# Patient Record
Sex: Female | Born: 1990 | Hispanic: Yes | Marital: Single | Smoking: Never smoker
Health system: Southern US, Community
[De-identification: ages and names within clinical notes are randomized; demographics above are authoritative.]

## PROBLEM LIST (undated history)

## (undated) ENCOUNTER — Inpatient Hospital Stay (HOSPITAL_COMMUNITY): Payer: Self-pay

## (undated) DIAGNOSIS — J45909 Unspecified asthma, uncomplicated: Secondary | ICD-10-CM

## (undated) HISTORY — DX: Unspecified asthma, uncomplicated: J45.909

## (undated) HISTORY — PX: NO PAST SURGERIES: SHX2092

---

## 2016-05-14 LAB — OB RESULTS CONSOLE PLATELET COUNT: PLATELETS: 348 10*3/uL

## 2016-05-14 LAB — OB RESULTS CONSOLE HGB/HCT, BLOOD
HCT: 38 %
Hemoglobin: 13.3 g/dL

## 2016-05-14 LAB — SICKLE CELL SCREEN: SICKLE CELL SCREEN: NEGATIVE

## 2016-05-14 LAB — OB RESULTS CONSOLE TSH: TSH: 0.16

## 2016-05-14 LAB — CYSTIC FIBROSIS DIAGNOSTIC STUDY: INTERPRETATION-CFDNA: NEGATIVE

## 2016-05-14 LAB — OB RESULTS CONSOLE ABO/RH: RH Type: POSITIVE

## 2016-05-14 LAB — OB RESULTS CONSOLE ANTIBODY SCREEN: Antibody Screen: NEGATIVE

## 2016-05-14 LAB — OB RESULTS CONSOLE HIV ANTIBODY (ROUTINE TESTING)
HIV: NONREACTIVE
HIV: NONREACTIVE

## 2016-05-29 LAB — OB RESULTS CONSOLE RUBELLA ANTIBODY, IGM: RUBELLA: IMMUNE

## 2016-05-29 LAB — OB RESULTS CONSOLE RPR: RPR: NONREACTIVE

## 2016-05-29 LAB — OB RESULTS CONSOLE HEPATITIS B SURFACE ANTIGEN: HEP B S AG: NEGATIVE

## 2016-06-07 NOTE — L&D Delivery Note (Signed)
26 y.o. G1P0 at 4730w2d delivered a viable female infant in cephalic, ROA position. Anterior shoulder delivered with ease. 60 sec delayed cord clamping. Cord clamped x2 and cut. Placenta delivered spontaneously intact, with 3VC. Fundus firm on exam with massage and pitocin. Good hemostasis noted.  Anesthesia: Epidural Laceration: bilateral labial lacerations, minor, no sutures required Good hemostasis noted. EBL: 200 cc  Mom and baby recovering in LDR.    Apgars: APGAR (1 MIN): 9   APGAR (5 MINS): 9     Weight: Pending skin to skin  Sponge and instrument count were correct x2. Placenta sent to L&D  Howard PouchLauren Deatrice Spanbauer, MD PGY-1 Family Medicine 11/09/2016, 4:42 PM

## 2016-08-13 LAB — OB RESULTS CONSOLE HGB/HCT, BLOOD
HCT: 34 %
Hemoglobin: 11.5 g/dL

## 2016-08-13 LAB — OB RESULTS CONSOLE PLATELET COUNT: PLATELETS: 334 10*3/uL

## 2016-09-02 ENCOUNTER — Encounter: Payer: Self-pay | Admitting: *Deleted

## 2016-09-08 ENCOUNTER — Encounter: Payer: Self-pay | Admitting: *Deleted

## 2016-09-24 ENCOUNTER — Ambulatory Visit (INDEPENDENT_AMBULATORY_CARE_PROVIDER_SITE_OTHER): Payer: BLUE CROSS/BLUE SHIELD | Admitting: Obstetrics and Gynecology

## 2016-09-24 ENCOUNTER — Encounter: Payer: Self-pay | Admitting: Obstetrics and Gynecology

## 2016-09-24 VITALS — BP 122/72 | HR 86 | Ht 62.0 in | Wt 170.1 lb

## 2016-09-24 DIAGNOSIS — O9989 Other specified diseases and conditions complicating pregnancy, childbirth and the puerperium: Secondary | ICD-10-CM | POA: Diagnosis not present

## 2016-09-24 DIAGNOSIS — Z3403 Encounter for supervision of normal first pregnancy, third trimester: Secondary | ICD-10-CM | POA: Diagnosis not present

## 2016-09-24 DIAGNOSIS — Z113 Encounter for screening for infections with a predominantly sexual mode of transmission: Secondary | ICD-10-CM | POA: Diagnosis not present

## 2016-09-24 DIAGNOSIS — N898 Other specified noninflammatory disorders of vagina: Secondary | ICD-10-CM

## 2016-09-24 LAB — OB RESULTS CONSOLE GC/CHLAMYDIA: Gonorrhea: NEGATIVE

## 2016-09-24 NOTE — Progress Notes (Signed)
  Subjective:    Kim Powell is a G1P0 [redacted]w[redacted]d being seen today for her first obstetrical visit.  Her obstetrical history is significant for transfer of care from [place]. Patient does intend to breast feed. Pregnancy history fully reviewed.  Patient reports no complaints.  Vitals:   09/24/16 0956 09/24/16 0957  BP: 122/72   Pulse: 86   Weight: 170 lb 1.6 oz (77.2 kg)   Height:   (1.575 m)    HISTORY: OB History  Gravida Para Term Preterm AB Living  1            SAB TAB Ectopic Multiple Live Births               # Outcome Date GA Lbr Len/2nd Weight Sex Delivery Anes PTL Lv  1 Current              Past Medical History:  Diagnosis Date  . Asthma    Past Surgical History:  Procedure Laterality Date  . NO PAST SURGERIES     Family History  Problem Relation Age of Onset  . Cancer Maternal Grandmother      Exam    Uterus:  Fundal Height: 32 cm  Pelvic Exam:       Assessment:    Pregnancy: G1P0 Patient Active Problem List   Diagnosis Date Noted  . Encounter for supervision of normal first pregnancy in third trimester 09/24/2016        Plan:     Initial labs reviewed- pap smear missing from records and requested Prenatal vitamins. Problem list reviewed and updated. Genetic Screening discussed Quad Screen: results reviewed.  Ultrasound discussed; fetal survey: results reviewed. Patient has concerns regarding yeast infection- wet prep collected Patient reports being followed as high risk due to fetal macrosomia- ultrasound ordered for growth  Follow up in 2 weeks. 50% of 30 min visit spent on counseling and coordination of care.     Dianne Whelchel 09/24/2016

## 2016-09-24 NOTE — Progress Notes (Signed)
OB US scheduled for May 2nd @ 1245.  Pt notified

## 2016-09-24 NOTE — Progress Notes (Signed)
New OB packet given  Decline TDAP and FLU Has yeast infection  Last pap 03/2016 Baby scripts app Plans to breastfeed

## 2016-09-24 NOTE — Patient Instructions (Signed)
Third Trimester of Pregnancy The third trimester is from week 28 through week 40 (months 7 through 9). The third trimester is a time when the unborn baby (fetus) is growing rapidly. At the end of the ninth month, the fetus is about 20 inches in length and weighs 6-10 pounds. Body changes during your third trimester Your body will continue to go through many changes during pregnancy. The changes vary from woman to woman. During the third trimester:  Your weight will continue to increase. You can expect to gain 25-35 pounds (11-16 kg) by the end of the pregnancy.  You may begin to get stretch marks on your hips, abdomen, and breasts.  You may urinate more often because the fetus is moving lower into your pelvis and pressing on your bladder.  You may develop or continue to have heartburn. This is caused by increased hormones that slow down muscles in the digestive tract.  You may develop or continue to have constipation because increased hormones slow digestion and cause the muscles that push waste through your intestines to relax.  You may develop hemorrhoids. These are swollen veins (varicose veins) in the rectum that can itch or be painful.  You may develop swollen, bulging veins (varicose veins) in your legs.  You may have increased body aches in the pelvis, back, or thighs. This is due to weight gain and increased hormones that are relaxing your joints.  You may have changes in your hair. These can include thickening of your hair, rapid growth, and changes in texture. Some women also have hair loss during or after pregnancy, or hair that feels dry or thin. Your hair will most likely return to normal after your baby is born.  Your breasts will continue to grow and they will continue to become tender. A yellow fluid (colostrum) may leak from your breasts. This is the first milk you are producing for your baby.  Your belly button may stick out.  You may notice more swelling in your hands,  face, or ankles.  You may have increased tingling or numbness in your hands, arms, and legs. The skin on your belly may also feel numb.  You may feel short of breath because of your expanding uterus.  You may have more problems sleeping. This can be caused by the size of your belly, increased need to urinate, and an increase in your body's metabolism.  You may notice the fetus "dropping," or moving lower in your abdomen (lightening).  You may have increased vaginal discharge.  You may notice your joints feel loose and you may have pain around your pelvic bone. What to expect at prenatal visits You will have prenatal exams every 2 weeks until week 36. Then you will have weekly prenatal exams. During a routine prenatal visit:  You will be weighed to make sure you and the baby are growing normally.  Your blood pressure will be taken.  Your abdomen will be measured to track your baby's growth.  The fetal heartbeat will be listened to.  Any test results from the previous visit will be discussed.  You may have a cervical check near your due date to see if your cervix has softened or thinned (effaced).  You will be tested for Group B streptococcus. This happens between 35 and 37 weeks. Your health care provider may ask you:  What your birth plan is.  How you are feeling.  If you are feeling the baby move.  If you have had any abnormal   symptoms, such as leaking fluid, bleeding, severe headaches, or abdominal cramping.  If you are using any tobacco products, including cigarettes, chewing tobacco, and electronic cigarettes.  If you have any questions. Other tests or screenings that may be performed during your third trimester include:  Blood tests that check for low iron levels (anemia).  Fetal testing to check the health, activity level, and growth of the fetus. Testing is done if you have certain medical conditions or if there are problems during the pregnancy.  Nonstress test  (NST). This test checks the health of your baby to make sure there are no signs of problems, such as the baby not getting enough oxygen. During this test, a belt is placed around your belly. The baby is made to move, and its heart rate is monitored during movement. What is false labor? False labor is a condition in which you feel small, irregular tightenings of the muscles in the womb (contractions) that usually go away with rest, changing position, or drinking water. These are called Braxton Hicks contractions. Contractions may last for hours, days, or even weeks before true labor sets in. If contractions come at regular intervals, become more frequent, increase in intensity, or become painful, you should see your health care provider. What are the signs of labor?  Abdominal cramps.  Regular contractions that start at 10 minutes apart and become stronger and more frequent with time.  Contractions that start on the top of the uterus and spread down to the lower abdomen and back.  Increased pelvic pressure and dull back pain.  A watery or bloody mucus discharge that comes from the vagina.  Leaking of amniotic fluid. This is also known as your "water breaking." It could be a slow trickle or a gush. Let your health care provider know if it has a color or strange odor. If you have any of these signs, call your health care provider right away, even if it is before your due date. Follow these instructions at home: Medicines   Follow your health care provider's instructions regarding medicine use. Specific medicines may be either safe or unsafe to take during pregnancy.  Take a prenatal vitamin that contains at least 600 micrograms (mcg) of folic acid.  If you develop constipation, try taking a stool softener if your health care provider approves. Eating and drinking   Eat a balanced diet that includes fresh fruits and vegetables, whole grains, good sources of protein such as meat, eggs, or tofu,  and low-fat dairy. Your health care provider will help you determine the amount of weight gain that is right for you.  Avoid raw meat and uncooked cheese. These carry germs that can cause birth defects in the baby.  If you have low calcium intake from food, talk to your health care provider about whether you should take a daily calcium supplement.  Eat four or five small meals rather than three large meals a day.  Limit foods that are high in fat and processed sugars, such as fried and sweet foods.  To prevent constipation:  Drink enough fluid to keep your urine clear or pale yellow.  Eat foods that are high in fiber, such as fresh fruits and vegetables, whole grains, and beans. Activity   Exercise only as directed by your health care provider. Most women can continue their usual exercise routine during pregnancy. Try to exercise for 30 minutes at least 5 days a week. Stop exercising if you experience uterine contractions.  Avoid heavy lifting.  lifting.  Do not exercise in extreme heat or humidity, or at high altitudes.  Wear low-heel, comfortable shoes.  Practice good posture.  You may continue to have sex unless your health care provider tells you otherwise. Relieving pain and discomfort   Take frequent breaks and rest with your legs elevated if you have leg cramps or low back pain.  Take warm sitz baths to soothe any pain or discomfort caused by hemorrhoids. Use hemorrhoid cream if your health care provider approves.  Wear a good support bra to prevent discomfort from breast tenderness.  If you develop varicose veins:  Wear support pantyhose or compression stockings as told by your healthcare provider.  Elevate your feet for 15 minutes, 3-4 times a day. Prenatal care   Write down your questions. Take them to your prenatal visits.  Keep all your prenatal visits as told by your health care provider. This is important. Safety   Wear your seat belt  at all times when driving.  Make a list of emergency phone numbers, including numbers for family, friends, the hospital, and police and fire departments. General instructions   Avoid cat litter boxes and soil used by cats. These carry germs that can cause birth defects in the baby. If you have a cat, ask someone to clean the litter box for you.  Do not travel far distances unless it is absolutely necessary and only with the approval of your health care provider.  Do not use hot tubs, steam rooms, or saunas.  Do not drink alcohol.  Do not use any products that contain nicotine or tobacco, such as cigarettes and e-cigarettes. If you need help quitting, ask your health care provider.  Do not use any medicinal herbs or unprescribed drugs. These chemicals affect the formation and growth of the baby.  Do not douche or use tampons or scented sanitary pads.  Do not cross your legs for long periods of time.  To prepare for the arrival of your baby:  Take prenatal classes to understand, practice, and ask questions about labor and delivery.  Make a trial run to the hospital.  Visit the hospital and tour the maternity area.  Arrange for maternity or paternity leave through employers.  Arrange for family and friends to take care of pets while you are in the hospital.  Purchase a rear-facing car seat and make sure you know how to install it in your car.  Pack your hospital bag.  Prepare the baby's nursery. Make sure to remove all pillows and stuffed animals from the baby's crib to prevent suffocation.  Visit your dentist if you have not gone during your pregnancy. Use a soft toothbrush to brush your teeth and be gentle when you floss. Contact a health care provider if:  You are unsure if you are in labor or if your water has broken.  You become dizzy.  You have mild pelvic cramps, pelvic pressure, or nagging pain in your abdominal area.  You have lower back pain.  You have  persistent nausea, vomiting, or diarrhea.  You have an unusual or bad smelling vaginal discharge.  You have pain when you urinate. Get help right away if:  Your water breaks before 37 weeks.  You have regular contractions less than 5 minutes apart before 37 weeks.  You have a fever.  You are leaking fluid from your vagina.  You have spotting or bleeding from your vagina.  You have severe abdominal pain or cramping.  You have rapid weight   weight gain.  You have shortness of breath with chest pain.  You notice sudden or extreme swelling of your face, hands, ankles, feet, or legs.  Your baby makes fewer than 10 movements in 2 hours.  You have severe headaches that do not go away when you take medicine.  You have vision changes. Summary  The third trimester is from week 28 through week 40, months 7 through 9. The third trimester is a time when the unborn baby (fetus) is growing rapidly.  During the third trimester, your discomfort may increase as you and your baby continue to gain weight. You may have abdominal, leg, and back pain, sleeping problems, and an increased need to urinate.  During the third trimester your breasts will keep growing and they will continue to become tender. A yellow fluid (colostrum) may leak from your breasts. This is the first milk you are producing for your baby.  False labor is a condition in which you feel small, irregular tightenings of the muscles in the womb (contractions) that eventually go away. These are called Braxton Hicks contractions. Contractions may last for hours, days, or even weeks before true labor sets in.  Signs of labor can include: abdominal cramps; regular contractions that start at 10 minutes apart and become stronger and more frequent with time; watery or bloody mucus discharge that comes from the vagina; increased pelvic pressure and dull back pain; and leaking of amniotic fluid. This information is not intended to replace advice given  to you by your health care provider. Make sure you discuss any questions you have with your health care provider. Document Released: 05/18/2001 Document Revised: 10/30/2015 Document Reviewed: 07/25/2012 Elsevier Interactive Patient Education  2017 [employer].  Breastfeeding Deciding to breastfeed is one of the best choices you can make for you and your baby. A change in hormones during pregnancy causes your breast tissue to grow and increases the number and size of your milk ducts. These hormones also allow proteins, sugars, and fats from your blood supply to make breast milk in your milk-producing glands. Hormones prevent breast milk from being released before your baby is born as well as prompt milk flow after birth. Once breastfeeding has begun, thoughts of your baby, as well as his or her sucking or crying, can stimulate the release of milk from your milk-producing glands. Benefits of breastfeeding For Your Baby  Your first milk (colostrum) helps your baby's digestive system function better.  There are antibodies in your milk that help your baby fight off infections.  Your baby has a lower incidence of asthma, allergies, and sudden infant death syndrome.  The nutrients in breast milk are better for your baby than infant formulas and are designed uniquely for your baby's needs.  Breast milk improves your baby's brain development.  Your baby is less likely to develop other conditions, such as childhood obesity, asthma, or type 2 diabetes mellitus. For You  Breastfeeding helps to create a very special bond between you and your baby.  Breastfeeding is convenient. Breast milk is always available at the correct temperature and costs nothing.  Breastfeeding helps to burn calories and helps you lose the weight gained during pregnancy.  Breastfeeding makes your uterus contract to its prepregnancy size faster and slows bleeding (lochia) after you give birth.  Breastfeeding helps to lower  your risk of developing type 2 diabetes mellitus, osteoporosis, and breast or ovarian cancer later in life. Signs that your baby is hungry Early Signs of Hunger  Increased  alertness or activity.  Stretching.  Movement of the head from side to side.  Movement of the head and opening of the mouth when the corner of the mouth or cheek is stroked (rooting).  Increased sucking sounds, smacking lips, cooing, sighing, or squeaking.  Hand-to-mouth movements.  Increased sucking of fingers or hands. Late Signs of Hunger  Fussing.  Intermittent crying. Extreme Signs of Hunger  Signs of extreme hunger will require calming and consoling before your baby will be able to breastfeed successfully. Do not wait for the following signs of extreme hunger to occur before you initiate breastfeeding:  Restlessness.  A loud, strong cry.  Screaming. Breastfeeding basics  Breastfeeding Initiation  Find a comfortable place to sit or lie down, with your neck and back well supported.  Place a pillow or rolled up blanket under your baby to bring him or her to the level of your breast (if you are seated). Nursing pillows are specially designed to help support your arms and your baby while you breastfeed.  Make sure that your baby's abdomen is facing your abdomen.  Gently massage your breast. With your fingertips, massage from your chest wall toward your nipple in a circular motion. This encourages milk flow. You may need to continue this action during the feeding if your milk flows slowly.  Support your breast with 4 fingers underneath and your thumb above your nipple. Make sure your fingers are well away from your nipple and your baby's mouth.  Stroke your baby's lips gently with your finger or nipple.  When your baby's mouth is open wide enough, quickly bring your baby to your breast, placing your entire nipple and as much of the colored area around your nipple (areola) as possible into your baby's  mouth.  More areola should be visible above your baby's upper lip than below the lower lip.  Your baby's tongue should be between his or her lower gum and your breast.  Ensure that your baby's mouth is correctly positioned around your nipple (latched). Your baby's lips should create a seal on your breast and be turned out (everted).  It is common for your baby to suck about 2-3 minutes in order to start the flow of breast milk. Latching  Teaching your baby how to latch on to your breast properly is very important. An improper latch can cause nipple pain and decreased milk supply for you and poor weight gain in your baby. Also, if your baby is not latched onto your nipple properly, he or she may swallow some air during feeding. This can make your baby fussy. Burping your baby when you switch breasts during the feeding can help to get rid of the air. However, teaching your baby to latch on properly is still the best way to prevent fussiness from swallowing air while breastfeeding. Signs that your baby has successfully latched on to your nipple:  Silent tugging or silent sucking, without causing you pain.  Swallowing heard between every 3-4 sucks.  Muscle movement above and in front of his or her ears while sucking. Signs that your baby has not successfully latched on to nipple:  Sucking sounds or smacking sounds from your baby while breastfeeding.  Nipple pain. If you think your baby has not latched on correctly, slip your finger into the corner of your baby's mouth to break the suction and place it between your baby's gums. Attempt breastfeeding initiation again. Signs of Successful Breastfeeding  Signs from your baby:  A gradual decrease in  the number of sucks or complete cessation of sucking.  Falling asleep.  Relaxation of his or her body.  Retention of a small amount of milk in his or her mouth.  Letting go of your breast by himself or herself. Signs from you:  Breasts that  have increased in firmness, weight, and size 1-3 hours after feeding.  Breasts that are softer immediately after breastfeeding.  Increased milk volume, as well as a change in milk consistency and color by the fifth day of breastfeeding.  Nipples that are not sore, cracked, or bleeding. Signs That Your Pecola Leisure is Getting Enough Milk  Wetting at least 1-2 diapers during the first 24 hours after birth.  Wetting at least 5-6 diapers every 24 hours for the first week after birth. The urine should be clear or pale yellow by 5 days after birth.  Wetting 6-8 diapers every 24 hours as your baby continues to grow and develop.  At least 3 stools in a 24-hour period by age 2 days. The stool should be soft and yellow.  At least 3 stools in a 24-hour period by age 24 days. The stool should be seedy and yellow.  No loss of weight greater than 10% of birth weight during the first 70 days of age.  Average weight gain of 4-7 ounces (113-198 g) per week after age 22 days.  Consistent daily weight gain by age 2 days, without weight loss after the age of 2 weeks. After a feeding, your baby may spit up a small amount. This is common. Breastfeeding frequency and duration Frequent feeding will help you make more milk and can prevent sore nipples and breast engorgement. Breastfeed when you feel the need to reduce the fullness of your breasts or when your baby shows signs of hunger. This is called "breastfeeding on demand." Avoid introducing a pacifier to your baby while you are working to establish breastfeeding (the first 4-6 weeks after your baby is born). After this time you may choose to use a pacifier. Research has shown that pacifier use during the first year of a baby's life decreases the risk of sudden infant death syndrome (SIDS). Allow your baby to feed on each breast as long as he or she wants. Breastfeed until your baby is finished feeding. When your baby unlatches or falls asleep while feeding from the  first breast, offer the second breast. Because newborns are often sleepy in the first few weeks of life, you may need to awaken your baby to get him or her to feed. Breastfeeding times will vary from baby to baby. However, the following rules can serve as a guide to help you ensure that your baby is properly fed:  Newborns (babies 64 weeks of age or younger) may breastfeed every 1-3 hours.  Newborns should not go longer than 3 hours during the day or 5 hours during the night without breastfeeding.  You should breastfeed your baby a minimum of 8 times in a 24-hour period until you begin to introduce solid foods to your baby at around 55 months of age. Breast milk pumping Pumping and storing breast milk allows you to ensure that your baby is exclusively fed your breast milk, even at times when you are unable to breastfeed. This is especially important if you are going back to work while you are still breastfeeding or when you are not able to be present during feedings. Your lactation consultant can give you guidelines on how long it is safe to store breast  milk. A breast pump is a machine that allows you to pump milk from your breast into a sterile bottle. The pumped breast milk can then be stored in a refrigerator or freezer. Some breast pumps are operated by hand, while others use electricity. Ask your lactation consultant which type will work best for you. Breast pumps can be purchased, but some hospitals and breastfeeding support groups lease breast pumps on a monthly basis. A lactation consultant can teach you how to hand express breast milk, if you prefer not to use a pump. Caring for your breasts while you breastfeed Nipples can become dry, cracked, and sore while breastfeeding. The following recommendations can help keep your breasts moisturized and healthy:  Avoid using soap on your nipples.  Wear a supportive bra. Although not required, special nursing bras and tank tops are designed to allow  access to your breasts for breastfeeding without taking off your entire bra or top. Avoid wearing underwire-style bras or extremely tight bras.  Air dry your nipples for 3-39minutes after each feeding.  Use only cotton bra pads to absorb leaked breast milk. Leaking of breast milk between feedings is normal.  Use lanolin on your nipples after breastfeeding. Lanolin helps to maintain your skin's normal moisture barrier. If you use pure lanolin, you do not need to wash it off before feeding your baby again. Pure lanolin is not toxic to your baby. You may also hand express a few drops of breast milk and gently massage that milk into your nipples and allow the milk to air dry. In the first few weeks after giving birth, some women experience extremely full breasts (engorgement). Engorgement can make your breasts feel heavy, warm, and tender to the touch. Engorgement peaks within 3-5 days after you give birth. The following recommendations can help ease engorgement:  Completely empty your breasts while breastfeeding or pumping. You may want to start by applying warm, moist heat (in the shower or with warm water-soaked hand towels) just before feeding or pumping. This increases circulation and helps the milk flow. If your baby does not completely empty your breasts while breastfeeding, pump any extra milk after he or she is finished.  Wear a snug bra (nursing or regular) or tank top for 1-2 days to signal your body to slightly decrease milk production.  Apply ice packs to your breasts, unless this is too uncomfortable for you.  Make sure that your baby is latched on and positioned properly while breastfeeding. If engorgement persists after 48 hours of following these recommendations, contact your health care provider or a Advertising copywriter. Overall health care recommendations while breastfeeding  Eat healthy foods. Alternate between meals and snacks, eating 3 of each per day. Because what you eat  affects your breast milk, some of the foods may make your baby more irritable than usual. Avoid eating these foods if you are sure that they are negatively affecting your baby.  Drink milk, fruit juice, and water to satisfy your thirst (about 10 glasses a day).  Rest often, relax, and continue to take your prenatal vitamins to prevent fatigue, stress, and anemia.  Continue breast self-awareness checks.  Avoid chewing and smoking tobacco. Chemicals from cigarettes that pass into breast milk and exposure to secondhand smoke may harm your baby.  Avoid alcohol and drug use, including marijuana. Some medicines that may be harmful to your baby can pass through breast milk. It is important to ask your health care provider before taking any medicine, including all over-the-counter and  prescription medicine as well as vitamin and herbal supplements. It is possible to become pregnant while breastfeeding. If birth control is desired, ask your health care provider about options that will be safe for your baby. Contact a health care provider if:  You feel like you want to stop breastfeeding or have become frustrated with breastfeeding.  You have painful breasts or nipples.  Your nipples are cracked or bleeding.  Your breasts are red, tender, or warm.  You have a swollen area on either breast.  You have a fever or chills.  You have nausea or vomiting.  You have drainage other than breast milk from your nipples.  Your breasts do not become full before feedings by the fifth day after you give birth.  You feel sad and depressed.  Your baby is too sleepy to eat well.  Your baby is having trouble sleeping.  Your baby is wetting less than 3 diapers in a 24-hour period.  Your baby has less than 3 stools in a 24-hour period.  Your baby's skin or the white part of his or her eyes becomes yellow.  Your baby is not gaining weight by 76 days of age. Get help right away if:  Your baby is overly  tired (lethargic) and does not want to wake up and feed.  Your baby develops an unexplained fever. This information is not intended to replace advice given to you by your health care provider. Make sure you discuss any questions you have with your health care provider. Document Released: 05/24/2005 Document Revised: 11/05/2015 Document Reviewed: 11/15/2012 Elsevier Interactive Patient Education  2017 Elsevier Inc.  Etonogestrel implant What is this medicine? ETONOGESTREL (et oh noe JES trel) is a contraceptive (birth control) device. It is used to prevent pregnancy. It can be used for up to 3 years. This medicine may be used for other purposes; ask your health care provider or pharmacist if you have questions. COMMON BRAND NAME(S): Implanon, Nexplanon What should I tell my health care provider before I take this medicine? They need to know if you have any of these conditions: -abnormal vaginal bleeding -blood vessel disease or blood clots -cancer of the breast, cervix, or liver -depression -diabetes -gallbladder disease -headaches -heart disease or recent heart attack -high blood pressure -high cholesterol -kidney disease -liver disease -renal disease -seizures -tobacco smoker -an unusual or allergic reaction to etonogestrel, other hormones, anesthetics or antiseptics, medicines, foods, dyes, or preservatives -pregnant or trying to get pregnant -breast-feeding How should I use this medicine? This device is inserted just under the skin on the inner side of your upper arm by a health care professional. Talk to your pediatrician regarding the use of this medicine in children. Special care may be needed. Overdosage: If you think you have taken too much of this medicine contact a poison control center or emergency room at once. NOTE: This medicine is only for you. Do not share this medicine with others. What if I miss a dose? This does not apply. What may interact with this  medicine? Do not take this medicine with any of the following medications: -amprenavir -bosentan -fosamprenavir This medicine may also interact with the following medications: -barbiturate medicines for inducing sleep or treating seizures -certain medicines for fungal infections like ketoconazole and itraconazole -grapefruit juice -griseofulvin -medicines to treat seizures like carbamazepine, felbamate, oxcarbazepine, phenytoin, topiramate -modafinil -phenylbutazone -rifampin -rufinamide -some medicines to treat HIV infection like atazanavir, indinavir, lopinavir, nelfinavir, tipranavir, ritonavir -St. John's wort This list may not describe  all possible interactions. Give your health care provider a list of all the medicines, herbs, non-prescription drugs, or dietary supplements you use. Also tell them if you smoke, drink alcohol, or use illegal drugs. Some items may interact with your medicine. What should I watch for while using this medicine? This product does not protect you against HIV infection (AIDS) or other sexually transmitted diseases. You should be able to feel the implant by pressing your fingertips over the skin where it was inserted. Contact your doctor if you cannot feel the implant, and use a non-hormonal birth control method (such as condoms) until your doctor confirms that the implant is in place. If you feel that the implant may have broken or become bent while in your arm, contact your healthcare provider. What side effects may I notice from receiving this medicine? Side effects that you should report to your doctor or health care professional as soon as possible: -allergic reactions like skin rash, itching or hives, swelling of the face, lips, or tongue -breast lumps -changes in emotions or moods -depressed mood -heavy or prolonged menstrual bleeding -pain, irritation, swelling, or bruising at the insertion site -scar at site of insertion -signs of infection at the  insertion site such as fever, and skin redness, pain or discharge -signs of pregnancy -signs and symptoms of a blood clot such as breathing problems; changes in vision; chest pain; severe, sudden headache; pain, swelling, warmth in the leg; trouble speaking; sudden numbness or weakness of the face, arm or leg -signs and symptoms of liver injury like dark yellow or brown urine; general ill feeling or flu-like symptoms; light-colored stools; loss of appetite; nausea; right upper belly pain; unusually weak or tired; yellowing of the eyes or skin -unusual vaginal bleeding, discharge -signs and symptoms of a stroke like changes in vision; confusion; trouble speaking or understanding; severe headaches; sudden numbness or weakness of the face, arm or leg; trouble walking; dizziness; loss of balance or coordination Side effects that usually do not require medical attention (report to your doctor or health care professional if they continue or are bothersome): -acne -back pain -breast pain -changes in weight -dizziness -general ill feeling or flu-like symptoms -headache -irregular menstrual bleeding -nausea -sore throat -vaginal irritation or inflammation This list may not describe all possible side effects. Call your doctor for medical advice about side effects. You may report side effects to FDA at 1-800-FDA-1088. Where should I keep my medicine? This drug is given in a hospital or clinic and will not be stored at home. NOTE: This sheet is a summary. It may not cover all possible information. If you have questions about this medicine, talk to your doctor, pharmacist, or health care provider.  2018 Elsevier/Gold Standard (2015-12-11 11:19:22)

## 2016-09-27 LAB — CERVICOVAGINAL ANCILLARY ONLY
Bacterial vaginitis: POSITIVE — AB
Chlamydia: NEGATIVE
Neisseria Gonorrhea: NEGATIVE
Trichomonas: NEGATIVE

## 2016-09-28 ENCOUNTER — Telehealth: Payer: Self-pay | Admitting: *Deleted

## 2016-09-28 ENCOUNTER — Encounter: Payer: Self-pay | Admitting: Obstetrics and Gynecology

## 2016-09-28 ENCOUNTER — Other Ambulatory Visit: Payer: Self-pay | Admitting: Obstetrics and Gynecology

## 2016-09-28 MED ORDER — METRONIDAZOLE 500 MG PO TABS: 500.0000 mg | ORAL_TABLET | Freq: Two times a day (BID) | ORAL | 0 refills | Status: DC

## 2016-09-28 NOTE — Telephone Encounter (Signed)
Per Dr. Jolayne Panther patient has bv and needs to be treated with Flagyl 500 mg bid for 7 days. Sent mychart message to patient informing her of the results and asked that she let us know what pharmacy to send medication to.

## 2016-10-06 ENCOUNTER — Ambulatory Visit (HOSPITAL_COMMUNITY)
Admission: RE | Admit: 2016-10-06 | Discharge: 2016-10-06 | Disposition: A | Discharge status: Home / Self Care | Payer: PRIVATE HEALTH INSURANCE | Source: Ambulatory Visit | Attending: Obstetrics and Gynecology | Admitting: Obstetrics and Gynecology

## 2016-10-06 DIAGNOSIS — Z3403 Encounter for supervision of normal first pregnancy, third trimester: Secondary | ICD-10-CM

## 2016-10-08 ENCOUNTER — Ambulatory Visit (INDEPENDENT_AMBULATORY_CARE_PROVIDER_SITE_OTHER): Payer: PRIVATE HEALTH INSURANCE | Admitting: Obstetrics and Gynecology

## 2016-10-08 VITALS — BP 119/74 | HR 89 | Wt 174.6 lb

## 2016-10-08 DIAGNOSIS — Z3403 Encounter for supervision of normal first pregnancy, third trimester: Secondary | ICD-10-CM

## 2016-10-08 NOTE — Progress Notes (Signed)
   PRENATAL VISIT NOTE  Subjective:  Kim Powell is a [age] y.o. G1P0 at 3144w5d being seen today for ongoing prenatal care.  She is currently monitored for the following issues for this low-risk pregnancy and has Encounter for supervision of normal first pregnancy in third trimester on her problem list.  Patient reports no complaints.  Contractions: Not present. Vag. Bleeding: None.  Movement: Present. Denies leaking of fluid.   The following portions of the patient's history were reviewed and updated as appropriate: allergies, current medications, past family history, past medical history, past social history, past surgical history and problem list. Problem list updated.  Objective:   Vitals:   10/08/16 0936  BP: 119/74  Pulse: 89  Weight: 174 lb 9.6 oz (79.2 kg)    Fetal Status: Fetal Heart Rate (bpm): 140 Fundal Height: 35 cm Movement: Present     General:  Alert, oriented and cooperative. Patient is in no acute distress.  Skin: Skin is warm and dry. No rash noted.   Cardiovascular: Normal heart rate noted  Respiratory: Normal respiratory effort, no problems with respiration noted  Abdomen: Soft, gravid, appropriate for gestational age. Pain/Pressure: Absent     Pelvic:  Cervical exam deferred        Extremities: Normal range of motion.  Edema: None  Mental Status: Normal mood and affect. Normal behavior. Normal judgment and thought content.   Assessment and Plan:  Pregnancy: G1P0 at 2644w5d  1. Encounter for supervision of normal first pregnancy in third trimester Patient is doing well without complaints Follow up ultrasound on 5/11 Patient still needs to research pediatrician   Preterm labor symptoms and general obstetric precautions including but not limited to vaginal bleeding, contractions, leaking of fluid and fetal movement were reviewed in detail with the patient. Please refer to After Visit Summary for other counseling recommendations.  Return in about 1 week (around  10/15/2016) for ROB.   Catalina AntiguaPeggy Kameron Glazebrook, MD

## 2016-10-15 ENCOUNTER — Ambulatory Visit (HOSPITAL_COMMUNITY)
Admission: RE | Admit: 2016-10-15 | Discharge: 2016-10-15 | Disposition: A | Discharge status: Home / Self Care | Payer: BLUE CROSS/BLUE SHIELD | Source: Ambulatory Visit | Attending: Obstetrics and Gynecology | Admitting: Obstetrics and Gynecology

## 2016-10-15 ENCOUNTER — Other Ambulatory Visit: Payer: Self-pay | Admitting: Obstetrics and Gynecology

## 2016-10-15 ENCOUNTER — Inpatient Hospital Stay (HOSPITAL_COMMUNITY)
Admission: AD | Admit: 2016-10-15 | Discharge: 2016-10-15 | Disposition: A | Discharge status: Home / Self Care | Payer: BLUE CROSS/BLUE SHIELD | Source: Ambulatory Visit | Attending: Family Medicine | Admitting: Family Medicine

## 2016-10-15 ENCOUNTER — Encounter (HOSPITAL_COMMUNITY): Payer: Self-pay | Admitting: *Deleted

## 2016-10-15 DIAGNOSIS — Z3689 Encounter for other specified antenatal screening: Secondary | ICD-10-CM

## 2016-10-15 DIAGNOSIS — O99513 Diseases of the respiratory system complicating pregnancy, third trimester: Secondary | ICD-10-CM | POA: Diagnosis not present

## 2016-10-15 DIAGNOSIS — Z87891 Personal history of nicotine dependence: Secondary | ICD-10-CM | POA: Insufficient documentation

## 2016-10-15 DIAGNOSIS — O26893 Other specified pregnancy related conditions, third trimester: Secondary | ICD-10-CM | POA: Diagnosis not present

## 2016-10-15 DIAGNOSIS — Z3A35 35 weeks gestation of pregnancy: Secondary | ICD-10-CM

## 2016-10-15 DIAGNOSIS — J45909 Unspecified asthma, uncomplicated: Secondary | ICD-10-CM | POA: Diagnosis not present

## 2016-10-15 DIAGNOSIS — Z3403 Encounter for supervision of normal first pregnancy, third trimester: Secondary | ICD-10-CM

## 2016-10-15 DIAGNOSIS — Z809 Family history of malignant neoplasm, unspecified: Secondary | ICD-10-CM | POA: Diagnosis not present

## 2016-10-15 DIAGNOSIS — N898 Other specified noninflammatory disorders of vagina: Secondary | ICD-10-CM | POA: Insufficient documentation

## 2016-10-15 LAB — WET PREP, GENITAL
CLUE CELLS WET PREP: NONE SEEN
SPERM: NONE SEEN
Trich, Wet Prep: NONE SEEN
Yeast Wet Prep HPF POC: NONE SEEN

## 2016-10-15 LAB — POCT FERN TEST: POCT FERN TEST: NEGATIVE

## 2016-10-15 NOTE — MAU Note (Signed)
Pt c/o leaking for the last four days. Pt states she will feel a little pressure and then notice that her underwear are wet. Pt states she feels it most when she sneezes or coughs. Pt states baby is moving normally. Pt denies bleeding and contractions.

## 2016-10-15 NOTE — Discharge Instructions (Signed)
Premature Rupture and Preterm Premature Rupture of Membranes °Rupture of membranes is when the membranes (amniotic sac) that hold your baby break open. This is commonly referred to as your "water breaking." If your water breaks before labor starts (prematurely), it is called premature rupture of membranes (PROM). If PROM occurs before 37 weeks of pregnancy, it is called preterm premature rupture of membranes (PPROM). Because the amniotic sac keeps infection out and performs other important functions, having the amniotic sac rupture before 37 weeks of pregnancy can lead to serious problems. It requires immediate attention from a health care provider. °What are the causes? °When PROM occurs at 37 weeks of pregnancy or later, it is usually caused by natural weakening of the membranes and friction caused by contractions. °PPROM is usually caused by infection. In many cases, the cause is not known. °What increases the risk of PPROM? °The following factors may make you more likely to have PPROM: °· Infection. °· Having had PPROM in a previous pregnancy. °· Short cervical length. °· Bleeding during the second or third trimester. °· Low BMI, which is an estimate of body fat. °· Smoking. °· Using drugs. °· Low socioeconomic status. °What problems can be caused by PROM and PPROM? °This condition creates health dangers for the mother and the baby. These include: °· Delivering a premature baby. °· Getting a serious infection of the placental tissues (chorioamnionitis). °· Early detachment of the placenta from the uterus (placental abruption). °· Compression of the umbilical cord. °· Developing a serious infection after delivery. °What are the signs of PROM and PPROM? °Signs of this condition include: °· A sudden gush or slow leaking of fluid from the vagina. °· Constant wet underwear. °·  °Sometimes, women mistake the leaking or wetness for urine, especially if the leak is slow and not a gush of fluid. If there is constant  leaking or if your underwear continues to get wet, your membranes have likely ruptured. ° °What should I do if I think my membranes have ruptured? °· Call your health care provider right away. °· You will need to go to the hospital immediately to be checked by a health care provider. °What happens if I am diagnosed with PROM or PPROM? °Once you arrive at the hospital, you will have tests done. A cervical exam will be done using a lubricated instrument (speculum) to check whether the cervix has softened or started to open (dilate). °· If you are diagnosed with PROM, your labor may be started for you (you may be induced) within 24 hours if you are not having contractions. °· If you are diagnosed with PPROM and you are not having contractions, you may be induced depending on your trimester. °If you have PPROM: °· You and your baby will be monitored closely for signs of infection or other complications. °· You may be given: °¨ An antibiotic medicine to lower the chances of developing an infection. °¨ A steroid medicine to help mature the baby's lungs more quickly. °¨ A medicine to help prevent cerebral palsy in your baby. °¨ A medicine to stop preterm labor. °· You may be ordered to be on bed rest at home or in the hospital. °· You may be induced if complications occur for you or the baby. °Your treatment will depend on many factors, such as how many weeks you have been pregnant (how far along you are), the development of the baby, and other complications that may occur. °This information is not intended to replace advice   by your health care provider. Make sure you discuss any questions you have with your health care provider. Document Released: 05/24/2005 Document Revised: 01/21/2016 Document Reviewed: 12/29/2015 Elsevier Interactive Patient Education  2017 [employer]

## 2016-10-15 NOTE — MAU Provider Note (Signed)
History     CSN: [id number]  Arrival date and time: 10/15/16 1422   First Provider Initiated Contact with Patient 10/15/16 1459      No chief complaint on file.  Kim Powell is a 26 y.o. G1P0 at [redacted]w[redacted]d sent from radiology department for evaluation of history of slight leaking for the past 4 days. She reports noting wetness of underwear intermittently particularly when she feels bladder and pelvic pressure. She is uncertain whether the leakage is urinary or vaginal. She denies dysuria urgency or frequency of urination. No vaginal irritation or itching. She completed a course of Flagyl last week for BV. She denies painful contractions or vaginal bleeding. Fetus is active. Pregnancy course is essentially uncomplicated except asthma with occasional albuterol usage. She initiated prenatal care in [place] and transferred to [facility].        Past Medical History:  Diagnosis Date  . Asthma     Past Surgical History:  Procedure Laterality Date  . NO PAST SURGERIES      Family History  Problem Relation Age of Onset  . Cancer Maternal Grandmother     Social History  Substance Use Topics  . Smoking status: Former Games developer  . Smokeless tobacco: Never Used  . Alcohol use Yes    Allergies: No Known Allergies  Prescriptions Prior to Admission  Medication Sig Dispense Refill Last Dose  . albuterol (PROVENTIL HFA;VENTOLIN HFA) 108 (90 Base) MCG/ACT inhaler Inhale 2 puffs into the lungs every 6 (six) hours as needed for wheezing or shortness of breath.   Past Month at Unknown time  . Prenatal Vit-Fe Fumarate-FA (PRENATAL MULTIVITAMIN) TABS tablet Take 1 tablet by mouth daily at 12 noon.   Past Week at Unknown time  . metroNIDAZOLE (FLAGYL) 500 MG tablet Take 1 tablet (500 mg total) by mouth 2 (two) times daily. (Patient not taking: Reported on 10/15/2016) 14 tablet 0 Not Taking at Unknown time    Review of Systems  Constitutional: Negative for fever.  Gastrointestinal: Negative for  abdominal pain and nausea.  Genitourinary: Negative for difficulty urinating, dyspareunia, dysuria, flank pain, frequency, hematuria, pelvic pain, urgency, vaginal bleeding, vaginal discharge and vaginal pain.  Musculoskeletal: Negative for back pain.  Neurological: Negative for headaches.   Physical Exam   Blood pressure 122/73, pulse (!) 108, temperature 98.2 F (36.8 C), temperature source Oral, resp. rate 18, height 5\' 2"  (1.575 m), weight 78.1 kg (172 lb 4 oz), last menstrual period 02/08/2016, SpO2 98 %.  Physical Exam  Nursing note and vitals reviewed. Constitutional: She is oriented to person, place, and time. She appears well-developed and well-nourished. No distress.  HENT:  Head: Normocephalic.  Eyes: No scleral icterus.  Neck: Normal range of motion.  Cardiovascular: Normal rate.   Respiratory: Effort normal.  GI: Soft. There is no tenderness.  Genitourinary: Vaginal discharge found.  Genitourinary Comments: Sterile speculum exam: NEFG, perineum dry; vagina with moderate amount of thick curdy white discharge swabbed from cervix which appears closed; declined digital exam  Musculoskeletal: Normal range of motion.  Neurological: She is alert and oriented to person, place, and time.  Skin: Skin is warm and dry.  Psychiatric: She has a normal mood and affect. Her behavior is normal.    MAU Course  Procedures Results for orders placed or performed during the hospital encounter of 10/15/16 (from the past 24 hour(s))  Wet prep, genital     Status: Abnormal   Collection Time: 10/15/16  1:10 PM  Result Value Ref Range  Yeast Wet Prep HPF POC NONE SEEN NONE SEEN   Trich, Wet Prep NONE SEEN NONE SEEN   Clue Cells Wet Prep HPF POC NONE SEEN NONE SEEN   WBC, Wet Prep HPF POC MANY (A) NONE SEEN   Sperm NONE SEEN   Fern Test     Status: Normal   Collection Time: 10/15/16  3:19 PM  Result Value Ref Range   POCT Fern Test Negative = intact amniotic membranes   [place] Mfm Ob Comp +  14 Wk  Result Date: 10/15/2016 ----------------------------------------------------------------------  OBSTETRICS REPORT                      (Signed Final 10/15/2016 03:08 pm) ---------------------------------------------------------------------- Patient Info  ID #:       [id number]                          D.O.B.:  05/27/1991 (25 yrs)  Name:       Kim Powell                    Visit Date: 10/15/2016 01:32 pm ---------------------------------------------------------------------- Performed By  Performed By:     Karolee Ohs Phy.:   [facility]                    [facility]                                                             [facility]                                                             [facility]                                                             [facility]  Attending:        Clarene Critchley Powell        Address:          [facility]                    MD                                                             [facility]  [address]                                                             [place], [place]                                                             [id number]  Referred By:      Kim Powell                  Location[facility]         [facility]                    CONSTANT MD  Ref. Address:     [address]                    [address]                    [place], [place]                    [id number] ---------------------------------------------------------------------- Orders   #  Description                                 Code   1  US MFM OB COMP + 14 WK                      76805.01  ----------------------------------------------------------------------   #  Ordered By               Order #        Accession #    Episode #   1  Kim Powell           [id number]      [id number]     [id number]   ---------------------------------------------------------------------- Indications   [redacted] weeks gestation of pregnancy                Z3A.35   Encounter for fetal anatomic survey            Z36.89  ---------------------------------------------------------------------- OB History  Gravidity:    1 ---------------------------------------------------------------------- Fetal Evaluation  Num Of Fetuses:     1  Fetal Heart         164  Rate(bpm):  Cardiac Activity:   Observed  Presentation:       Cephalic  Placenta:           Anterior, above cervical os  P. Cord Insertion:  Visualized, central  Amniotic Fluid  AFI FV:      Subjectively upper-normal  AFI Sum(cm)     %Tile  Largest Pocket(cm)  24.71           94          8.93  RUQ(cm)       RLQ(cm)       LUQ(cm)        LLQ(cm)  4.81          4.44          8.93           6.53 ---------------------------------------------------------------------- Biometry  BPD:      90.7  mm     G. Age:  36w 5d         83  %    CI:        78.59   %    70 - 86                                                          FL/HC:      21.5   %    20.1 - 22.1  HC:      323.6  mm     G. Age:  36w 4d         40  %    HC/AC:      0.98        0.93 - 1.11  AC:      330.7  mm     G. Age:  37w 0d         88  %    FL/BPD:     76.8   %    71 - 87  FL:       69.7  mm     G. Age:  35w 5d         46  %    FL/AC:      21.1   %    20 - 24  HUM:      63.4  mm     G. Age:  36w 6d         83  %  CER:      43.8  mm     G. Age:  37w 6d         69  %  CM:          7  mm  Est. FW:    2985  gm      6 lb 9 oz     79  % ---------------------------------------------------------------------- Gestational Age  LMP:           35w 5d        Date:  02/08/16                 EDD:   11/14/16  U/S Today:     36w 4d                                        EDD:   11/08/16  Best:          35w 5d     Det. By:  LMP  (02/08/16)          EDD:   11/14/16 ---------------------------------------------------------------------- Anatomy  Cranium:  Appears normal         Aortic Arch:            Not well visualized  Cavum:                 Appears normal         Ductal Arch:            Not well visualized  Ventricles:            Appears normal         Diaphragm:              Appears normal  Choroid Plexus:        Appears normal         Stomach:                Appears normal, left                                                                        sided  Cerebellum:            Appears normal         Abdomen:                Appears normal  Posterior Fossa:       Appears normal         Abdominal Wall:         Not well visualized  Face:                  Orbits appear          Cord Vessels:           Appears normal (3                         normal                                         vessel cord)  Lips:                  Appears normal         Kidneys:                Appear normal  Thoracic:              Appears normal         Bladder:                Appears normal  Heart:                 Appears normal         Spine:                  Appears normal                         (4CH, axis, and situs  RVOT:                  Not well visualized    Upper Extremities:  Visualized  LVOT:                  Not well visualized    Lower Extremities:      Visualized  Other:  Fetus appears to be a female. Technically difficult due to advanced GA          and fetal position. ---------------------------------------------------------------------- Impression  Single IUP at 35w 5d  Limited views of the fetal heart obtained due to late  gestational age and fetal position  The estimated fetal weight is at the 79th %tile  Anterior placenta without previa  Normal amniotic fluid volume ---------------------------------------------------------------------- Recommendations  Follow-up ultrasounds as clinically indicated. ----------------------------------------------------------------------                Candis Shine, MD Electronically Signed Final Report   10/15/2016  03:08 pm ----------------------------------------------------------------------   Fetal monitoring: Fetal heart rate baseline 145, moderate variability, accelerations and reactivity present, no decelerations  Toco: Slight irritability   MDM Without evidence for rupture of membranes, labor, vaginitis, UTI. Will discharge home with reassurance and labor precautions   Assessment and Plan  26 yo G1 at [redacted]w[redacted]d 1. Vaginal discharge during pregnancy in third trimester   2. Encounter for supervision of normal first pregnancy in third trimester    Allergies as of 10/15/2016   No Known Allergies     Medication List    STOP taking these medications   metroNIDAZOLE 500 MG tablet Commonly known as:  FLAGYL     TAKE these medications   albuterol 108 (90 Base) MCG/ACT inhaler Commonly known as:  PROVENTIL HFA;VENTOLIN HFA Inhale 2 puffs into the lungs every 6 (six) hours as needed for wheezing or shortness of breath.   prenatal multivitamin Tabs tablet Take 1 tablet by mouth daily at 12 noon.      Follow-up Information    [facility] Follow up on 10/18/2016.   Specialty:  Obstetrics and Gynecology Contact information: [address] [place] [phone]          Emmalyn Hinson 10/15/2016, 3:31 PM

## 2016-10-18 ENCOUNTER — Telehealth: Payer: Self-pay | Admitting: Obstetrics and Gynecology

## 2016-10-18 ENCOUNTER — Encounter: Payer: PRIVATE HEALTH INSURANCE | Admitting: Obstetrics and Gynecology

## 2016-10-18 NOTE — Telephone Encounter (Signed)
Called patient due to missed ob follow up. Rescheduled patient for 05/17, patient confirmed new appointment time.

## 2016-10-21 ENCOUNTER — Ambulatory Visit (INDEPENDENT_AMBULATORY_CARE_PROVIDER_SITE_OTHER): Payer: PRIVATE HEALTH INSURANCE | Admitting: Advanced Practice Midwife

## 2016-10-21 VITALS — BP 120/75 | HR 81 | Wt 172.5 lb

## 2016-10-21 DIAGNOSIS — Z3403 Encounter for supervision of normal first pregnancy, third trimester: Secondary | ICD-10-CM

## 2016-10-21 LAB — OB RESULTS CONSOLE GBS: GBS: NEGATIVE

## 2016-10-21 NOTE — Progress Notes (Signed)
Declines flu and tdap.

## 2016-10-22 NOTE — Patient Instructions (Signed)
Third Trimester of Pregnancy The third trimester is from week 28 through week 40 (months 7 through 9). The third trimester is a time when the unborn baby (fetus) is growing rapidly. At the end of the ninth month, the fetus is about 20 inches in length and weighs 6-10 pounds. Body changes during your third trimester Your body will continue to go through many changes during pregnancy. The changes vary from woman to woman. During the third trimester:  Your weight will continue to increase. You can expect to gain 25-35 pounds (11-16 kg) by the end of the pregnancy.  You may begin to get stretch marks on your hips, abdomen, and breasts.  You may urinate more often because the fetus is moving lower into your pelvis and pressing on your bladder.  You may develop or continue to have heartburn. This is caused by increased hormones that slow down muscles in the digestive tract.  You may develop or continue to have constipation because increased hormones slow digestion and cause the muscles that push waste through your intestines to relax.  You may develop hemorrhoids. These are swollen veins (varicose veins) in the rectum that can itch or be painful.  You may develop swollen, bulging veins (varicose veins) in your legs.  You may have increased body aches in the pelvis, back, or thighs. This is due to weight gain and increased hormones that are relaxing your joints.  You may have changes in your hair. These can include thickening of your hair, rapid growth, and changes in texture. Some women also have hair loss during or after pregnancy, or hair that feels dry or thin. Your hair will most likely return to normal after your baby is born.  Your breasts will continue to grow and they will continue to become tender. A yellow fluid (colostrum) may leak from your breasts. This is the first milk you are producing for your baby.  Your belly button may stick out.  You may notice more swelling in your hands,  face, or ankles.  You may have increased tingling or numbness in your hands, arms, and legs. The skin on your belly may also feel numb.  You may feel short of breath because of your expanding uterus.  You may have more problems sleeping. This can be caused by the size of your belly, increased need to urinate, and an increase in your body's metabolism.  You may notice the fetus "dropping," or moving lower in your abdomen (lightening).  You may have increased vaginal discharge.  You may notice your joints feel loose and you may have pain around your pelvic bone.  What to expect at prenatal visits You will have prenatal exams every 2 weeks until week 36. Then you will have weekly prenatal exams. During a routine prenatal visit:  You will be weighed to make sure you and the baby are growing normally.  Your blood pressure will be taken.  Your abdomen will be measured to track your baby's growth.  The fetal heartbeat will be listened to.  Any test results from the previous visit will be discussed.  You may have a cervical check near your due date to see if your cervix has softened or thinned (effaced).  You will be tested for Group B streptococcus. This happens between 35 and 37 weeks.  Your health care provider may ask you:  What your birth plan is.  How you are feeling.  If you are feeling the baby move.  If you have had   any abnormal symptoms, such as leaking fluid, bleeding, severe headaches, or abdominal cramping.  If you are using any tobacco products, including cigarettes, chewing tobacco, and electronic cigarettes.  If you have any questions.  Other tests or screenings that may be performed during your third trimester include:  Blood tests that check for low iron levels (anemia).  Fetal testing to check the health, activity level, and growth of the fetus. Testing is done if you have certain medical conditions or if there are problems during the  pregnancy.  Nonstress test (NST). This test checks the health of your baby to make sure there are no signs of problems, such as the baby not getting enough oxygen. During this test, a belt is placed around your belly. The baby is made to move, and its heart rate is monitored during movement.  What is false labor? False labor is a condition in which you feel small, irregular tightenings of the muscles in the womb (contractions) that usually go away with rest, changing position, or drinking water. These are called Braxton Hicks contractions. Contractions may last for hours, days, or even weeks before true labor sets in. If contractions come at regular intervals, become more frequent, increase in intensity, or become painful, you should see your health care provider. What are the signs of labor?  Abdominal cramps.  Regular contractions that start at 10 minutes apart and become stronger and more frequent with time.  Contractions that start on the top of the uterus and spread down to the lower abdomen and back.  Increased pelvic pressure and dull back pain.  A watery or bloody mucus discharge that comes from the vagina.  Leaking of amniotic fluid. This is also known as your "water breaking." It could be a slow trickle or a gush. Let your health care provider know if it has a color or strange odor. If you have any of these signs, call your health care provider right away, even if it is before your due date. Follow these instructions at home: Medicines  Follow your health care provider's instructions regarding medicine use. Specific medicines may be either safe or unsafe to take during pregnancy.  Take a prenatal vitamin that contains at least 600 micrograms (mcg) of folic acid.  If you develop constipation, try taking a stool softener if your health care provider approves. Eating and drinking  Eat a balanced diet that includes fresh fruits and vegetables, whole grains, good sources of protein  such as meat, eggs, or tofu, and low-fat dairy. Your health care provider will help you determine the amount of weight gain that is right for you.  Avoid raw meat and uncooked cheese. These carry germs that can cause birth defects in the baby.  If you have low calcium intake from food, talk to your health care provider about whether you should take a daily calcium supplement.  Eat four or five small meals rather than three large meals a day.  Limit foods that are high in fat and processed sugars, such as fried and sweet foods.  To prevent constipation: ? Drink enough fluid to keep your urine clear or pale yellow. ? Eat foods that are high in fiber, such as fresh fruits and vegetables, whole grains, and beans. Activity  Exercise only as directed by your health care provider. Most women can continue their usual exercise routine during pregnancy. Try to exercise for 30 minutes at least 5 days a week. Stop exercising if you experience uterine contractions.  Avoid heavy   lifting.  Do not exercise in extreme heat or humidity, or at high altitudes.  Wear low-heel, comfortable shoes.  Practice good posture.  You may continue to have sex unless your health care provider tells you otherwise. Relieving pain and discomfort  Take frequent breaks and rest with your legs elevated if you have leg cramps or low back pain.  Take warm sitz baths to soothe any pain or discomfort caused by hemorrhoids. Use hemorrhoid cream if your health care provider approves.  Wear a good support bra to prevent discomfort from breast tenderness.  If you develop varicose veins: ? Wear support pantyhose or compression stockings as told by your healthcare provider. ? Elevate your feet for 15 minutes, 3-4 times a day. Prenatal care  Write down your questions. Take them to your prenatal visits.  Keep all your prenatal visits as told by your health care provider. This is important. Safety  Wear your seat belt at  all times when driving.  Make a list of emergency phone numbers, including numbers for family, friends, the hospital, and police and fire departments. General instructions  Avoid cat litter boxes and soil used by cats. These carry germs that can cause birth defects in the baby. If you have a cat, ask someone to clean the litter box for you.  Do not travel far distances unless it is absolutely necessary and only with the approval of your health care provider.  Do not use hot tubs, steam rooms, or saunas.  Do not drink alcohol.  Do not use any products that contain nicotine or tobacco, such as cigarettes and e-cigarettes. If you need help quitting, ask your health care provider.  Do not use any medicinal herbs or unprescribed drugs. These chemicals affect the formation and growth of the baby.  Do not douche or use tampons or scented sanitary pads.  Do not cross your legs for long periods of time.  To prepare for the arrival of your baby: ? Take prenatal classes to understand, practice, and ask questions about labor and delivery. ? Make a trial run to the hospital. ? Visit the hospital and tour the maternity area. ? Arrange for maternity or paternity leave through employers. ? Arrange for family and friends to take care of pets while you are in the hospital. ? Purchase a rear-facing car seat and make sure you know how to install it in your car. ? Pack your hospital bag. ? Prepare the baby's nursery. Make sure to remove all pillows and stuffed animals from the baby's crib to prevent suffocation.  Visit your dentist if you have not gone during your pregnancy. Use a soft toothbrush to brush your teeth and be gentle when you floss. Contact a health care provider if:  You are unsure if you are in labor or if your water has broken.  You become dizzy.  You have mild pelvic cramps, pelvic pressure, or nagging pain in your abdominal area.  You have lower back pain.  You have persistent  nausea, vomiting, or diarrhea.  You have an unusual or bad smelling vaginal discharge.  You have pain when you urinate. Get help right away if:  Your water breaks before 37 weeks.  You have regular contractions less than 5 minutes apart before 37 weeks.  You have a fever.  You are leaking fluid from your vagina.  You have spotting or bleeding from your vagina.  You have severe abdominal pain or cramping.  You have rapid weight loss or weight gain.    You have shortness of breath with chest pain.  You notice sudden or extreme swelling of your face, hands, ankles, feet, or legs.  Your baby makes fewer than 10 movements in 2 hours.  You have severe headaches that do not go away when you take medicine.  You have vision changes. Summary  The third trimester is from week 28 through week 40, months 7 through 9. The third trimester is a time when the unborn baby (fetus) is growing rapidly.  During the third trimester, your discomfort may increase as you and your baby continue to gain weight. You may have abdominal, leg, and back pain, sleeping problems, and an increased need to urinate.  During the third trimester your breasts will keep growing and they will continue to become tender. A yellow fluid (colostrum) may leak from your breasts. This is the first milk you are producing for your baby.  False labor is a condition in which you feel small, irregular tightenings of the muscles in the womb (contractions) that eventually go away. These are called Braxton Hicks contractions. Contractions may last for hours, days, or even weeks before true labor sets in.  Signs of labor can include: abdominal cramps; regular contractions that start at 10 minutes apart and become stronger and more frequent with time; watery or bloody mucus discharge that comes from the vagina; increased pelvic pressure and dull back pain; and leaking of amniotic fluid. This information is not intended to replace advice  given to you by your health care provider. Make sure you discuss any questions you have with your health care provider. Document Released: 05/18/2001 Document Revised: 10/30/2015 Document Reviewed: 07/25/2012 Elsevier Interactive Patient Education  2017 [employer]  

## 2016-10-22 NOTE — Progress Notes (Signed)
   PRENATAL VISIT NOTE  Subjective:  Kim Powell is a 26 y.o. G1P0 at 3757w5d being seen today for ongoing prenatal care.  She is currently monitored for the following issues for this low-risk pregnancy and has Encounter for supervision of normal first pregnancy in third trimester on her problem list.  Patient reports no complaints.  Contractions: Not present. Vag. Bleeding: None.  Movement: Present. Denies leaking of fluid.   The following portions of the patient's history were reviewed and updated as appropriate: allergies, current medications, past family history, past medical history, past social history, past surgical history and problem list. Problem list updated.  Objective:   Vitals:   10/21/16 1057  BP: 120/75  Pulse: 81  Weight: 172 lb 8 oz (78.2 kg)    Fetal Status: Fetal Heart Rate (bpm): 127   Movement: Present     General:  Alert, oriented and cooperative. Patient is in no acute distress.  Skin: Skin is warm and dry. No rash noted.   Cardiovascular: Normal heart rate noted  Respiratory: Normal respiratory effort, no problems with respiration noted  Abdomen: Soft, gravid, appropriate for gestational age. Pain/Pressure: Present     Pelvic:  Cervix FT/long/Ballot  Extremities: Normal range of motion.  Edema: Trace  Mental Status: Normal mood and affect. Normal behavior. Normal judgment and thought content.   Assessment and Plan:  Pregnancy: G1P0 at 3457w5d  1. Encounter for supervision of normal first pregnancy in third trimester  - Culture, beta strep (group b only)  Preterm labor symptoms and general obstetric precautions including but not limited to vaginal bleeding, contractions, leaking of fluid and fetal movement were reviewed in detail with the patient. Please refer to After Visit Summary for other counseling recommendations.  RTC 1 week   Wynelle BourgeoisMarie Akyla Vavrek, CNM

## 2016-10-25 LAB — CULTURE, BETA STREP (GROUP B ONLY): Strep Gp B Culture: NEGATIVE

## 2016-10-28 ENCOUNTER — Ambulatory Visit (INDEPENDENT_AMBULATORY_CARE_PROVIDER_SITE_OTHER): Payer: PRIVATE HEALTH INSURANCE | Admitting: Advanced Practice Midwife

## 2016-10-28 VITALS — BP 132/79 | HR 92 | Wt 173.3 lb

## 2016-10-28 DIAGNOSIS — R51 Headache: Secondary | ICD-10-CM

## 2016-10-28 DIAGNOSIS — Z3403 Encounter for supervision of normal first pregnancy, third trimester: Secondary | ICD-10-CM

## 2016-10-28 DIAGNOSIS — R519 Headache, unspecified: Secondary | ICD-10-CM | POA: Insufficient documentation

## 2016-10-28 DIAGNOSIS — O26893 Other specified pregnancy related conditions, third trimester: Secondary | ICD-10-CM | POA: Insufficient documentation

## 2016-10-28 NOTE — Patient Instructions (Signed)
Hypertension During Pregnancy °Hypertension, commonly called high blood pressure, is when the force of blood pumping through your arteries is too strong. Arteries are blood vessels that carry blood from the heart throughout the body. Hypertension during pregnancy can cause problems for you and your baby. Your baby may be born early (prematurely) or may not weigh as much as he or she should at birth. Very bad cases of hypertension during pregnancy can be life-threatening. °Different types of hypertension can occur during pregnancy. These include: °· Chronic hypertension. This happens when: °¨ You have hypertension before pregnancy and it continues during pregnancy. °¨ You develop hypertension before you are [redacted] weeks pregnant, and it continues during pregnancy. °· Gestational hypertension. This is hypertension that develops after the 20th week of pregnancy. °· Preeclampsia, also called toxemia of pregnancy. This is a very serious type of hypertension that develops only during pregnancy. It affects the whole body, and it can be very dangerous for you and your baby. °Gestational hypertension and preeclampsia usually go away within 6 weeks after your baby is born. Women who have hypertension during pregnancy have a greater chance of developing hypertension later in life or during future pregnancies. °What are the causes? °The exact cause of hypertension is not known. °What increases the risk? °There are certain factors that make it more likely for you to develop hypertension during pregnancy. These include: °· Having hypertension during a previous pregnancy or prior to pregnancy. °· Being overweight. °· Being older than age 40. °· Being pregnant for the first time or being pregnant with more than one baby. °· Becoming pregnant using fertilization methods such as IVF (in vitro fertilization). °· Having diabetes, kidney problems, or systemic lupus erythematosus. °· Having a family history of hypertension. °What are the  signs or symptoms? °Chronic hypertension and gestational hypertension rarely cause symptoms. Preeclampsia causes symptoms, which may include: °· Increased protein in your urine. Your health care provider will check for this at every visit before you give birth (prenatal visit). °· Severe headaches. °· Sudden weight gain. °· Swelling of the hands, face, legs, and feet. °· Nausea and vomiting. °· Vision problems, such as blurred or double vision. °· Numbness in the face, arms, legs, and feet. °· Dizziness. °· Slurred speech. °· Sensitivity to bright lights. °· Abdominal pain. °· Convulsions. °How is this diagnosed? °You may be diagnosed with hypertension during a routine prenatal exam. At each prenatal visit, you may: °· Have a urine test to check for high amounts of protein in your urine. °· Have your blood pressure checked. A blood pressure reading is recorded as two numbers, such as "120 over 80" (or 120/80). The first ("top") number is called the systolic pressure. It is a measure of the pressure in your arteries when your heart beats. The second ("bottom") number is called the diastolic pressure. It is a measure of the pressure in your arteries as your heart relaxes between beats. Blood pressure is measured in a unit called mm Hg. A normal blood pressure reading is: °¨ Systolic: below 120. °¨ Diastolic: below 80. °The type of hypertension that you are diagnosed with depends on your test results and when your symptoms developed. °· Chronic hypertension is usually diagnosed before 20 weeks of pregnancy. °· Gestational hypertension is usually diagnosed after 20 weeks of pregnancy. °· Hypertension with high amounts of protein in the urine is diagnosed as preeclampsia. °· Blood pressure measurements that stay above 160 systolic, or above 110 diastolic, are signs of severe preeclampsia. °  How is this treated? °Treatment for hypertension during pregnancy varies depending on the type of hypertension you have and how  serious it is. °· If you take medicines called ACE inhibitors to treat chronic hypertension, you may need to switch medicines. ACE inhibitors should not be taken during pregnancy. °· If you have gestational hypertension, you may need to take blood pressure medicine. °· If you are at risk for preeclampsia, your health care provider may recommend that you take a low-dose aspirin every day to prevent high blood pressure during your pregnancy. °· If you have severe preeclampsia, you may need to be hospitalized so you and your baby can be monitored closely. You may also need to take medicine (magnesium sulfate) to prevent seizures and to lower blood pressure. This medicine may be given as an injection or through an IV tube. °· In some cases, if your condition gets worse, you may need to deliver your baby early. °Follow these instructions at home: °Eating and drinking °· Drink enough fluid to keep your urine clear or pale yellow. °· Eat a healthy diet that is low in salt (sodium). Do not add salt to your food. Check food labels to see how much sodium a food or beverage contains. °Lifestyle °· Do not use any products that contain nicotine or tobacco, such as cigarettes and e-cigarettes. If you need help quitting, ask your health care provider. °· Do not use alcohol. °· Avoid caffeine. °· Avoid stress as much as possible. Rest and get plenty of sleep. °General instructions °· Take over-the-counter and prescription medicines only as told by your health care provider. °· While lying down, lie on your left side. This keeps pressure off your baby. °· While sitting or lying down, raise (elevate) your feet. Try putting some pillows under your lower legs. °· Exercise regularly. Ask your health care provider what kinds of exercise are best for you. °· Keep all prenatal and follow-up visits as told by your health care provider. This is important. °Contact a health care provider if: °· You have symptoms that your health care provider  told you may require more treatment or monitoring, such as: °¨ Fever. °¨ Vomiting. °¨ Headache. °Get help right away if: °· You have severe abdominal pain or vomiting that does not get better with treatment. °· You suddenly develop swelling in your hands, ankles, or face. °· You gain 4 lbs (1.8 kg) or more in 1 week. °· You develop vaginal bleeding, or you have blood in your urine. °· You do not feel your baby moving as much as usual. °· You have blurred or double vision. °· You have muscle twitching or sudden tightening (spasms). °· You have shortness of breath. °· Your lips or fingernails turn blue. °This information is not intended to replace advice given to you by your health care provider. Make sure you discuss any questions you have with your health care provider. °Document Released: 02/09/2011 Document Revised: 12/12/2015 Document Reviewed: 11/07/2015 °Elsevier Interactive Patient Education © 2017 [employer] ° °

## 2016-10-28 NOTE — Progress Notes (Signed)
   PRENATAL VISIT NOTE  Subjective:  Kim Powell is a 26 y.o. G1P0 at 6w4dbeing seen today for ongoing prenatal care.  She is currently monitored for the following issues for this low-risk pregnancy and has Encounter for supervision of normal first pregnancy in third trimester and Headache in pregnancy, antepartum, third trimester on her problem list.  Patient reports headache.  Denies vision changes or epigastric pain. Hasn't tried anything for HA.  Contractions: Not present. Vag. Bleeding: None.  Movement: Present. Denies leaking of fluid.   The following portions of the patient's history were reviewed and updated as appropriate: allergies, current medications, past family history, past medical history, past social history, past surgical history and problem list. Problem list updated.  Objective:   Vitals:   10/28/16 1430 10/28/16 1432  BP: 137/81 132/79  Pulse: 92   Weight: 173 lb 4.8 oz (78.6 kg)     Fetal Status: Fetal Heart Rate (bpm): 135 Fundal Height: 38 cm Movement: Present  Presentation: Vertex  General:  Alert, oriented and cooperative. Patient is in no acute distress.  Skin: Skin is warm and dry. No rash noted.   Cardiovascular: Normal heart rate noted  Respiratory: Normal respiratory effort, no problems with respiration noted  Abdomen: Soft, gravid, appropriate for gestational age. Pain/Pressure: Present     Pelvic:  Cervical exam deferred        Extremities: Normal range of motion.  Edema: Trace  Mental Status: Normal mood and affect. Normal behavior. Normal judgment and thought content.   Assessment and Plan:  Pregnancy: G1P0 at 355w4d1. Headache in pregnancy, antepartum, third trimester  - CBC - Comp Met (CMET) - Protein / creatinine ratio, urine - Pre-E precautions.   2. Encounter for supervision of normal first pregnancy in third trimester   Term labor symptoms and general obstetric precautions including but not limited to vaginal bleeding,  contractions, leaking of fluid and fetal movement were reviewed in detail with the patient. Please refer to After Visit Summary for other counseling recommendations.  Return in about 1 week (around 11[place]04/2016[place]  ViManya SilvasCNM

## 2016-10-28 NOTE — Progress Notes (Signed)
Breastfeeding tip reviewed Pt has had h/a for 2 days, hasn't taken anything for it

## 2016-10-29 LAB — COMPREHENSIVE METABOLIC PANEL
ALBUMIN: 3.6 g/dL (ref 3.5–5.5)
ALK PHOS: 166 IU/L — AB (ref 39–117)
ALT: 12 IU/L (ref 0–32)
AST: 19 IU/L (ref 0–40)
Albumin/Globulin Ratio: 1.1 — ABNORMAL LOW (ref 1.2–2.2)
BILIRUBIN TOTAL: 0.4 mg/dL (ref 0.0–1.2)
BUN / CREAT RATIO: 11 (ref 9–23)
BUN: 8 mg/dL (ref 6–20)
CHLORIDE: 104 mmol/L (ref 96–106)
CO2: 19 mmol/L (ref 18–29)
Calcium: 9 mg/dL (ref 8.7–10.2)
Creatinine, Ser: 0.73 mg/dL (ref 0.57–1.00)
GFR calc non Af Amer: 116 mL/min/{1.73_m2} (ref >59)
GFR, EST AFRICAN AMERICAN: 134 mL/min/{1.73_m2} (ref >59)
Globulin, Total: 3.3 g/dL (ref 1.5–4.5)
Glucose: 73 mg/dL (ref 65–99)
Potassium: 4.4 mmol/L (ref 3.5–5.2)
SODIUM: 136 mmol/L (ref 134–144)
TOTAL PROTEIN: 6.9 g/dL (ref 6.0–8.5)

## 2016-10-29 LAB — PROTEIN / CREATININE RATIO, URINE
CREATININE, UR: 197 mg/dL
Protein, Ur: 30.5 mg/dL
Protein/Creat Ratio: 155 mg/g creat (ref 0–200)

## 2016-10-29 LAB — CBC
HEMATOCRIT: 35.4 % (ref 34.0–46.6)
HEMOGLOBIN: 12.1 g/dL (ref 11.1–15.9)
MCH: 29.4 pg (ref 26.6–33.0)
MCHC: 34.2 g/dL (ref 31.5–35.7)
MCV: 86 fL (ref 79–97)
Platelets: 310 10*3/uL (ref 150–379)
RBC: 4.11 x10E6/uL (ref 3.77–5.28)
RDW: 13.6 % (ref 12.3–15.4)
WBC: 11.2 10*3/uL — ABNORMAL HIGH (ref 3.4–10.8)

## 2016-11-03 ENCOUNTER — Encounter: Payer: Self-pay | Admitting: Advanced Practice Midwife

## 2016-11-03 ENCOUNTER — Ambulatory Visit (INDEPENDENT_AMBULATORY_CARE_PROVIDER_SITE_OTHER): Payer: PRIVATE HEALTH INSURANCE | Admitting: Advanced Practice Midwife

## 2016-11-03 VITALS — BP 135/84 | HR 83 | Wt 174.4 lb

## 2016-11-03 DIAGNOSIS — R51 Headache: Secondary | ICD-10-CM

## 2016-11-03 DIAGNOSIS — Z3403 Encounter for supervision of normal first pregnancy, third trimester: Secondary | ICD-10-CM

## 2016-11-03 DIAGNOSIS — O26893 Other specified pregnancy related conditions, third trimester: Secondary | ICD-10-CM

## 2016-11-03 NOTE — Patient Instructions (Signed)

## 2016-11-03 NOTE — Progress Notes (Signed)
   PRENATAL VISIT NOTE  Subjective:  Kim Powell is a 26 y.o. G1P0 at 4574w3d being seen today for ongoing prenatal care.  She is currently monitored for the following issues for this low-risk pregnancy and has Encounter for supervision of normal first pregnancy in third trimester and Headache in pregnancy, antepartum, third trimester on her problem list.  Patient reports no complaints.  Contractions: Not present. Vag. Bleeding: None.  Movement: Present. Denies leaking of fluid.   The following portions of the patient's history were reviewed and updated as appropriate: allergies, current medications, past family history, past medical history, past social history, past surgical history and problem list. Problem list updated.  Objective:   Vitals:   11/03/16 1522  BP: 135/84  Pulse: 83  Weight: 174 lb 6.4 oz (79.1 kg)    Fetal Status: Fetal Heart Rate (bpm): 145   Movement: Present     General:  Alert, oriented and cooperative. Patient is in no acute distress.  Skin: Skin is warm and dry. No rash noted.   Cardiovascular: Normal heart rate noted  Respiratory: Normal respiratory effort, no problems with respiration noted  Abdomen: Soft, gravid, appropriate for gestational age. Pain/Pressure: Absent     Pelvic:  Cervical exam deferred        Extremities: Normal range of motion.  Edema: Trace  Mental Status: Normal mood and affect. Normal behavior. Normal judgment and thought content.   Assessment and Plan:  Pregnancy: G1P0 at 4874w3d  1. Encounter for supervision of normal first pregnancy in third trimester     Declines cervix check     Not many contractions    Discussed signs of labor, exercise, not exerting herself too much, relaxation  2. Headache in pregnancy, antepartum, third trimester     BP high normal today again     Term labor symptoms and general obstetric precautions including but not limited to vaginal bleeding, contractions, leaking of fluid and fetal movement were  reviewed in detail with the patient. Please refer to After Visit Summary for other counseling recommendations.  Return in about 1 week (around 11/10/2016) for [facility].   Wynelle BourgeoisMarie Mayra Jolliffe, CNM

## 2016-11-05 ENCOUNTER — Encounter: Payer: Self-pay | Admitting: Advanced Practice Midwife

## 2016-11-08 ENCOUNTER — Inpatient Hospital Stay (HOSPITAL_COMMUNITY)
Admission: AD | Admit: 2016-11-08 | Discharge: 2016-11-11 | DRG: 775 | Disposition: A | Discharge status: Home / Self Care | Payer: BLUE CROSS/BLUE SHIELD | Source: Ambulatory Visit | Attending: Family Medicine | Admitting: Family Medicine

## 2016-11-08 ENCOUNTER — Encounter (HOSPITAL_COMMUNITY): Payer: Self-pay

## 2016-11-08 DIAGNOSIS — J45909 Unspecified asthma, uncomplicated: Secondary | ICD-10-CM | POA: Diagnosis present

## 2016-11-08 DIAGNOSIS — Z3A39 39 weeks gestation of pregnancy: Secondary | ICD-10-CM

## 2016-11-08 DIAGNOSIS — R030 Elevated blood-pressure reading, without diagnosis of hypertension: Secondary | ICD-10-CM

## 2016-11-08 DIAGNOSIS — Z3493 Encounter for supervision of normal pregnancy, unspecified, third trimester: Secondary | ICD-10-CM | POA: Diagnosis present

## 2016-11-08 DIAGNOSIS — O9952 Diseases of the respiratory system complicating childbirth: Secondary | ICD-10-CM | POA: Diagnosis present

## 2016-11-08 DIAGNOSIS — Z87891 Personal history of nicotine dependence: Secondary | ICD-10-CM

## 2016-11-08 DIAGNOSIS — O479 False labor, unspecified: Secondary | ICD-10-CM

## 2016-11-08 LAB — COMPREHENSIVE METABOLIC PANEL
ALBUMIN: 3.3 g/dL — AB (ref 3.5–5.0)
ALK PHOS: 199 U/L — AB (ref 38–126)
ALT: 12 U/L — AB (ref 14–54)
AST: 21 U/L (ref 15–41)
Anion gap: 11 (ref 5–15)
BILIRUBIN TOTAL: 0.3 mg/dL (ref 0.3–1.2)
BUN: 8 mg/dL (ref 6–20)
CO2: 18 mmol/L — ABNORMAL LOW (ref 22–32)
CREATININE: 0.67 mg/dL (ref 0.44–1.00)
Calcium: 9.1 mg/dL (ref 8.9–10.3)
Chloride: 106 mmol/L (ref 101–111)
GFR calc Af Amer: >60 mL/min (ref >60)
GLUCOSE: 70 mg/dL (ref 65–99)
POTASSIUM: 3.7 mmol/L (ref 3.5–5.1)
Sodium: 135 mmol/L (ref 135–145)
TOTAL PROTEIN: 7.4 g/dL (ref 6.5–8.1)

## 2016-11-08 LAB — URINALYSIS, ROUTINE W REFLEX MICROSCOPIC
GLUCOSE, UA: NEGATIVE mg/dL
Ketones, ur: 40 mg/dL — AB
NITRITE: NEGATIVE
PH: 6.5 (ref 5.0–8.0)
PROTEIN: NEGATIVE mg/dL
Specific Gravity, Urine: 1.02 (ref 1.005–1.030)

## 2016-11-08 LAB — CBC
HEMATOCRIT: 34.7 % — AB (ref 36.0–46.0)
HEMOGLOBIN: 12.1 g/dL (ref 12.0–15.0)
MCH: 30 pg (ref 26.0–34.0)
MCHC: 34.9 g/dL (ref 30.0–36.0)
MCV: 86.1 fL (ref 78.0–100.0)
Platelets: 290 10*3/uL (ref 150–400)
RBC: 4.03 MIL/uL (ref 3.87–5.11)
RDW: 13.1 % (ref 11.5–15.5)
WBC: 14.3 10*3/uL — ABNORMAL HIGH (ref 4.0–10.5)

## 2016-11-08 LAB — TYPE AND SCREEN
ABO/RH(D): O POS
ANTIBODY SCREEN: NEGATIVE

## 2016-11-08 LAB — URINALYSIS, MICROSCOPIC (REFLEX)

## 2016-11-08 MED ORDER — LACTATED RINGERS IV SOLN
INTRAVENOUS | Status: DC
  Administered 2016-11-08 – 2016-11-09 (×2): via INTRAVENOUS

## 2016-11-08 MED ORDER — LACTATED RINGERS IV SOLN: 500.0000 mL | INTRAVENOUS | Status: DC | PRN

## 2016-11-08 MED ORDER — ONDANSETRON HCL 4 MG/2ML IJ SOLN
4.0000 mg | Freq: Four times a day (QID) | INTRAMUSCULAR | Status: DC | PRN
  Administered 2016-11-09: 4 mg via INTRAVENOUS
  Filled 2016-11-08: qty 2

## 2016-11-08 MED ORDER — SOD CITRATE-CITRIC ACID 500-334 MG/5ML PO SOLN: 30.0000 mL | ORAL | Status: DC | PRN

## 2016-11-08 MED ORDER — OXYTOCIN BOLUS FROM INFUSION
500.0000 mL | Freq: Once | INTRAVENOUS | Status: AC
  Administered 2016-11-09: 500 mL via INTRAVENOUS

## 2016-11-08 MED ORDER — FLEET ENEMA 7-19 GM/118ML RE ENEM: 1.0000 | ENEMA | RECTAL | Status: DC | PRN

## 2016-11-08 MED ORDER — ACETAMINOPHEN 325 MG PO TABS: 650.0000 mg | ORAL_TABLET | ORAL | Status: DC | PRN

## 2016-11-08 MED ORDER — OXYTOCIN 40 UNITS IN LACTATED RINGERS INFUSION - SIMPLE MED: 2.5000 [IU]/h | INTRAVENOUS | Status: DC

## 2016-11-08 MED ORDER — LIDOCAINE HCL (PF) 1 % IJ SOLN
30.0000 mL | INTRAMUSCULAR | Status: DC | PRN
  Filled 2016-11-08: qty 30

## 2016-11-08 MED ORDER — OXYCODONE-ACETAMINOPHEN 5-325 MG PO TABS: 2.0000 | ORAL_TABLET | ORAL | Status: DC | PRN

## 2016-11-08 MED ORDER — OXYCODONE-ACETAMINOPHEN 5-325 MG PO TABS: 1.0000 | ORAL_TABLET | ORAL | Status: DC | PRN

## 2016-11-08 NOTE — H&P (Signed)
LABOR ADMISSION HISTORY AND PHYSICAL  Kim Powell is a 26 y.o. female G1P0 with IUP at [redacted]w[redacted]d presenting for SOL. She reports +FM, + contractions, No LOF, no VB, no blurry vision, headaches or peripheral edema, and RUQ pain.  She plans on breast  feeding. She requests nexplanon for birth control.   Dating: By LMP --->  Estimated Date of Delivery: 11/14/16  Prenatal History/Complications:  Past Medical History: Past Medical History:  Diagnosis Date  . Asthma     Past Surgical History: Past Surgical History:  Procedure Laterality Date  . NO PAST SURGERIES      Obstetrical History: OB History    Gravida Para Term Preterm AB Living   1             SAB TAB Ectopic Multiple Live Births                  Social History: Social History   Social History  . Marital status: Single    Spouse name: N/A  . Number of children: N/A  . Years of education: N/A   Social History Main Topics  . Smoking status: Former Games developer  . Smokeless tobacco: Never Used  . Alcohol use Yes  . Drug use: Yes    Types: Marijuana  . Sexual activity: Yes   Other Topics Concern  . None   Social History Narrative  . None    Family History: Family History  Problem Relation Age of Onset  . Cancer Maternal Grandmother     Allergies: No Known Allergies  Prescriptions Prior to Admission  Medication Sig Dispense Refill Last Dose  . albuterol (PROVENTIL HFA;VENTOLIN HFA) 108 (90 Base) MCG/ACT inhaler Inhale 2 puffs into the lungs every 6 (six) hours as needed for wheezing or shortness of breath.   Taking  . Prenatal Vit-Fe Fumarate-FA (PRENATAL MULTIVITAMIN) TABS tablet Take 1 tablet by mouth daily at 12 noon.   Taking     Review of Systems   All systems reviewed and negative except as stated in HPI  BP 130/84 (BP Location: Left Arm)   Pulse 99   Temp 98.6 F (37 C)   Resp 20   Ht 5\' 3"  (1.6 m)   Wt 79.8 kg (176 lb)   LMP 02/08/2016 (LMP Unknown)   SpO2 100%   BMI 31.18 kg/m  General  appearance: alert, cooperative and appears stated age Lungs: no respiratory distress no auditory  Heart: regular rate and rhythm Abdomen: soft, non-tender; bowel sounds normal Extremities: Homans sign is negative, no sign of DVT, edema Fetal monitoring: 145/mod var reactive Uterine activityNone Dilation: 4 Effacement (%): 80 Station: -2 Exam by:: Scientist, product/process development RN   Prenatal labs: ABO, Rh: O/Positive/-- (12/08 0000) Antibody: Negative (12/08 0000) Rubella: Immune RPR:   Nonreactive HBsAg:   Negative  HIV: Non-reactive (12/08 0000)  GBS:   Negative  1 hr Glucola 85 Genetic screening  Normal Anatomy US Normal  Prenatal Transfer Tool  Maternal Diabetes: No Genetic Screening: Normal Maternal Ultrasounds/Referrals: Normal Fetal Ultrasounds or other Referrals:  None Maternal Substance Abuse:  No Significant Maternal Medications:  None Significant Maternal Lab Results: None  Results for orders placed or performed during the hospital encounter of 11/08/16 (from the past 24 hour(s))  Urinalysis, Routine w reflex microscopic   Collection Time: 11/08/16  7:59 PM  Result Value Ref Range   Color, Urine YELLOW YELLOW   APPearance CLEAR CLEAR   Specific Gravity, Urine 1.020 1.005 - 1.030   pH  6.5 5.0 - 8.0   Glucose, UA NEGATIVE NEGATIVE mg/dL   Hgb urine dipstick MODERATE (A) NEGATIVE   Bilirubin Urine SMALL (A) NEGATIVE   Ketones, ur 40 (A) NEGATIVE mg/dL   Protein, ur NEGATIVE NEGATIVE mg/dL   Nitrite NEGATIVE NEGATIVE   Leukocytes, UA MODERATE (A) NEGATIVE  Urinalysis, Microscopic (reflex)   Collection Time: 11/08/16  7:59 PM  Result Value Ref Range   RBC / HPF 0-5 0 - 5 RBC/hpf   WBC, UA 0-5 0 - 5 WBC/hpf   Bacteria, UA FEW (A) NONE SEEN   Squamous Epithelial / LPF 6-30 (A) NONE SEEN    Patient Active Problem List   Diagnosis Date Noted  . Headache in pregnancy, antepartum, third trimester 10/28/2016  . Encounter for supervision of normal first pregnancy in third  trimester 09/24/2016    Assessment: Kim Powell is a 26 y.o. G1P0 at 3040w1d here for  SOL  #Labor:expectant mangment at this time #Pain: Plans for no pain medicine, but if it gets to bad will use IV and then epdirual #FWB: Category 1 #ID:  GBS negative #MOF: breast #MOC:nexplanon #Circ:  Yes in patient  Lavella HammockAlessandra Tomasi, MS3  OB FELLOW HISTORY AND PHYSICAL ATTESTATION  I confirm that I have verified the information documented in the medical student's note and that I have also personally reperformed the physical exam and all medical decision making activities.      Ernestina Pennaicholas Ally Knodel 11/09/2016, 12:56 AM

## 2016-11-08 NOTE — MAU Note (Signed)
Pt reports contractions every 5 mins. Pt denies LOF. Has some bloody mucous. Reports good fetal movement. States cervix was closed on last exam.

## 2016-11-08 NOTE — MAU Provider Note (Signed)
History     CSN: [id number]  Arrival date and time: 11/08/16 1925  First Provider Initiated Contact with Patient 11/08/16 2103      Chief Complaint  Patient presents with  . Labor Eval   HPI Kim Powell is a 26 y.o. G1P0 at [redacted]w[redacted]d who presents with contractions. I was asked to see patient in MAU d/t elevated BPs.  Patient reports contractions every 5 minutes since this evening. Has noted bloody discharge since prior to arrival. Rates pain 9/10. Has not treated. Denies LOF. Positive fetal movement. States cervix was closed during last visit.  Was evaluated for PIH last month d/t headache but did not have hypertension & labs were normal.  Denies headache, visual disturbance, or epigastric pain at this time.   OB History    Gravida Para Term Preterm AB Living   1             SAB TAB Ectopic Multiple Live Births                  Past Medical History:  Diagnosis Date  . Asthma     Past Surgical History:  Procedure Laterality Date  . NO PAST SURGERIES      Family History  Problem Relation Age of Onset  . Cancer Maternal Grandmother     Social History  Substance Use Topics  . Smoking status: Former Games developer  . Smokeless tobacco: Never Used  . Alcohol use Yes    Allergies: No Known Allergies  Prescriptions Prior to Admission  Medication Sig Dispense Refill Last Dose  . albuterol (PROVENTIL HFA;VENTOLIN HFA) 108 (90 Base) MCG/ACT inhaler Inhale 2 puffs into the lungs every 6 (six) hours as needed for wheezing or shortness of breath.   Taking  . Prenatal Vit-Fe Fumarate-FA (PRENATAL MULTIVITAMIN) TABS tablet Take 1 tablet by mouth daily at 12 noon.   Taking    Review of Systems  Constitutional: Negative.   Eyes: Negative for visual disturbance.  Cardiovascular: Negative for leg swelling.  Gastrointestinal: Positive for abdominal pain.  Genitourinary: Positive for vaginal bleeding.  Neurological: Negative for headaches.   Physical Exam   Dilation: 4 Effacement  (%): 80 Station: -2 Presentation: Vertex (confirmed by [place], Dr. Omer Jack) Exam by:: Amber Stovall RN   Blood pressure 130/84, pulse 99, temperature 98.6 F (37 C), resp. rate 20, height 5\' 3"  (1.6 m), weight 176 lb (79.8 kg), last menstrual period 02/08/2016, SpO2 100 %.  Temp:  [98.6 F (37 C)] 98.6 F (37 C) (06/04 1957) Pulse Rate:  [89-99] 99 (06/04 2045) Resp:  [18-20] 20 (06/04 2045) BP: (130-144)/(84-103) 130/84 (06/04 2045) SpO2:  [100 %] 100 % (06/04 1957) Weight:  [176 lb (79.8 kg)] 176 lb (79.8 kg) (06/04 1957)   Physical Exam  Nursing note and vitals reviewed. Constitutional: She is oriented to person, place, and time. She appears well-developed and well-nourished. She appears distressed (pt breathing & moaning with contractions).  HENT:  Head: Normocephalic and atraumatic.  Eyes: Conjunctivae are normal. Right eye exhibits no discharge. Left eye exhibits no discharge. No scleral icterus.  Neck: Normal range of motion.  Cardiovascular: Normal rate, regular rhythm and normal heart sounds.   No murmur heard. Respiratory: Effort normal and breath sounds normal. No respiratory distress. She has no wheezes.  GI: Soft.  Ctx palpate moderate  Neurological: She is alert and oriented to person, place, and time.  Skin: Skin is warm and dry. She is not diaphoretic.  Psychiatric: She has a normal  mood and affect. Her behavior is normal. Judgment and thought content normal.   Fetal Tracing:  Baseline: 140 Variability: moderate Accelerations: 15x15 Decelerations: none  Toco: Q2-6 min MAU Course  Procedures Results for orders placed or performed during the hospital encounter of 11/08/16 (from the past 24 hour(s))  Urinalysis, Routine w reflex microscopic     Status: Abnormal   Collection Time: 11/08/16  7:59 PM  Result Value Ref Range   Color, Urine YELLOW YELLOW   APPearance CLEAR CLEAR   Specific Gravity, Urine 1.020 1.005 - 1.030   pH 6.5 5.0 - 8.0   Glucose, UA  NEGATIVE NEGATIVE mg/dL   Hgb urine dipstick MODERATE (A) NEGATIVE   Bilirubin Urine SMALL (A) NEGATIVE   Ketones, ur 40 (A) NEGATIVE mg/dL   Protein, ur NEGATIVE NEGATIVE mg/dL   Nitrite NEGATIVE NEGATIVE   Leukocytes, UA MODERATE (A) NEGATIVE  Urinalysis, Microscopic (reflex)     Status: Abnormal   Collection Time: 11/08/16  7:59 PM  Result Value Ref Range   RBC / HPF 0-5 0 - 5 RBC/hpf   WBC, UA 0-5 0 - 5 WBC/hpf   Bacteria, UA FEW (A) NONE SEEN   Squamous Epithelial / LPF 6-30 (A) NONE SEEN    MDM Reactive fetal tracing SVE 4/80/BBOW & blood show; pt appears uncomfortable w/ctx Initial BPs elevated, not severe range. [facility] with [facility] orders Vertex presentation verified by bedside ultrasound performed by Dr. Omer JackMumaw.  Assessment and Plan  A:  1. Indication for care in labor and delivery, antepartum   2. Uterine contractions during pregnancy   3. Elevated BP without diagnosis of hypertension    P: Admit to birthing suites GBS negative Category 1 tracing Patient undecided on pain management at this time -- may want to start with IV pain medication or nitrous Care turned over to Dr. San MorelleSchenk  Shaya Altamura 11/08/2016, 9:03 PM

## 2016-11-09 ENCOUNTER — Encounter (HOSPITAL_COMMUNITY): Payer: Self-pay | Admitting: *Deleted

## 2016-11-09 ENCOUNTER — Inpatient Hospital Stay (HOSPITAL_COMMUNITY): Payer: BLUE CROSS/BLUE SHIELD | Admitting: Anesthesiology

## 2016-11-09 DIAGNOSIS — Z3A39 39 weeks gestation of pregnancy: Secondary | ICD-10-CM

## 2016-11-09 LAB — RPR: RPR Ser Ql: NONREACTIVE

## 2016-11-09 LAB — PROTEIN / CREATININE RATIO, URINE
CREATININE, URINE: 188 mg/dL
Protein Creatinine Ratio: 0.14 mg/mg{Cre} (ref 0.00–0.15)
TOTAL PROTEIN, URINE: 26 mg/dL

## 2016-11-09 LAB — ABO/RH: ABO/RH(D): O POS

## 2016-11-09 MED ORDER — LIDOCAINE HCL (PF) 1 % IJ SOLN
INTRAMUSCULAR | Status: DC | PRN
  Administered 2016-11-09 (×2): 5 mL via EPIDURAL

## 2016-11-09 MED ORDER — WITCH HAZEL-GLYCERIN EX PADS: 1.0000 "application " | MEDICATED_PAD | CUTANEOUS | Status: DC | PRN

## 2016-11-09 MED ORDER — PHENYLEPHRINE 40 MCG/ML (10ML) SYRINGE FOR IV PUSH (FOR BLOOD PRESSURE SUPPORT)
80.0000 ug | PREFILLED_SYRINGE | INTRAVENOUS | Status: DC | PRN
  Filled 2016-11-09: qty 10
  Filled 2016-11-09: qty 5

## 2016-11-09 MED ORDER — TETANUS-DIPHTH-ACELL PERTUSSIS 5-2.5-18.5 LF-MCG/0.5 IM SUSP: 0.5000 mL | Freq: Once | INTRAMUSCULAR | Status: DC

## 2016-11-09 MED ORDER — SENNOSIDES-DOCUSATE SODIUM 8.6-50 MG PO TABS
2.0000 | ORAL_TABLET | ORAL | Status: DC
  Administered 2016-11-09 – 2016-11-10 (×2): 2 via ORAL
  Filled 2016-11-09 (×2): qty 2

## 2016-11-09 MED ORDER — PRENATAL MULTIVITAMIN CH
1.0000 | ORAL_TABLET | Freq: Every day | ORAL | Status: DC
  Administered 2016-11-10: 1 via ORAL
  Filled 2016-11-09: qty 1

## 2016-11-09 MED ORDER — DIPHENHYDRAMINE HCL 50 MG/ML IJ SOLN: 12.5000 mg | INTRAMUSCULAR | Status: DC | PRN

## 2016-11-09 MED ORDER — BENZOCAINE-MENTHOL 20-0.5 % EX AERO
1.0000 "application " | INHALATION_SPRAY | CUTANEOUS | Status: DC | PRN
  Administered 2016-11-09: 1 via TOPICAL
  Filled 2016-11-09: qty 56

## 2016-11-09 MED ORDER — COCONUT OIL OIL: 1.0000 "application " | TOPICAL_OIL | Status: DC | PRN

## 2016-11-09 MED ORDER — IBUPROFEN 600 MG PO TABS
600.0000 mg | ORAL_TABLET | Freq: Four times a day (QID) | ORAL | Status: DC
  Administered 2016-11-09 – 2016-11-11 (×7): 600 mg via ORAL
  Filled 2016-11-09 (×7): qty 1

## 2016-11-09 MED ORDER — OXYTOCIN 40 UNITS IN LACTATED RINGERS INFUSION - SIMPLE MED
1.0000 m[IU]/min | INTRAVENOUS | Status: DC
  Administered 2016-11-09: 2 m[IU]/min via INTRAVENOUS
  Filled 2016-11-09: qty 1000

## 2016-11-09 MED ORDER — ONDANSETRON HCL 4 MG/2ML IJ SOLN: 4.0000 mg | INTRAMUSCULAR | Status: DC | PRN

## 2016-11-09 MED ORDER — BUTORPHANOL TARTRATE 1 MG/ML IJ SOLN
2.0000 mg | Freq: Once | INTRAMUSCULAR | Status: AC
  Administered 2016-11-09: 2 mg via INTRAVENOUS
  Filled 2016-11-09: qty 2

## 2016-11-09 MED ORDER — SIMETHICONE 80 MG PO CHEW: 80.0000 mg | CHEWABLE_TABLET | ORAL | Status: DC | PRN

## 2016-11-09 MED ORDER — TERBUTALINE SULFATE 1 MG/ML IJ SOLN
0.2500 mg | Freq: Once | INTRAMUSCULAR | Status: DC | PRN
  Filled 2016-11-09: qty 1

## 2016-11-09 MED ORDER — DIBUCAINE 1 % RE OINT: 1.0000 "application " | TOPICAL_OINTMENT | RECTAL | Status: DC | PRN

## 2016-11-09 MED ORDER — ACETAMINOPHEN 325 MG PO TABS
650.0000 mg | ORAL_TABLET | ORAL | Status: DC | PRN
  Administered 2016-11-11: 650 mg via ORAL
  Filled 2016-11-09: qty 2

## 2016-11-09 MED ORDER — DIPHENHYDRAMINE HCL 25 MG PO CAPS: 25.0000 mg | ORAL_CAPSULE | Freq: Four times a day (QID) | ORAL | Status: DC | PRN

## 2016-11-09 MED ORDER — ONDANSETRON HCL 4 MG PO TABS
4.0000 mg | ORAL_TABLET | ORAL | Status: DC | PRN
Start: 2016-11-09 — End: 2016-11-11

## 2016-11-09 MED ORDER — FENTANYL 2.5 MCG/ML BUPIVACAINE 1/10 % EPIDURAL INFUSION (WH - ANES)
14.0000 mL/h | INTRAMUSCULAR | Status: DC | PRN
  Administered 2016-11-09 (×2): 14 mL/h via EPIDURAL
  Filled 2016-11-09 (×2): qty 100

## 2016-11-09 MED ORDER — PHENYLEPHRINE 40 MCG/ML (10ML) SYRINGE FOR IV PUSH (FOR BLOOD PRESSURE SUPPORT)
80.0000 ug | PREFILLED_SYRINGE | INTRAVENOUS | Status: DC | PRN
  Filled 2016-11-09: qty 5

## 2016-11-09 MED ORDER — EPHEDRINE 5 MG/ML INJ
10.0000 mg | INTRAVENOUS | Status: DC | PRN
  Filled 2016-11-09: qty 2

## 2016-11-09 MED ORDER — FENTANYL CITRATE (PF) 100 MCG/2ML IJ SOLN: 100.0000 ug | INTRAMUSCULAR | Status: DC | PRN

## 2016-11-09 MED ORDER — ZOLPIDEM TARTRATE 5 MG PO TABS: 5.0000 mg | ORAL_TABLET | Freq: Every evening | ORAL | Status: DC | PRN

## 2016-11-09 MED ORDER — LACTATED RINGERS IV SOLN
500.0000 mL | Freq: Once | INTRAVENOUS | Status: AC
  Administered 2016-11-09: 500 mL via INTRAVENOUS

## 2016-11-09 NOTE — Progress Notes (Signed)
Patient seen and doing well. No complaints. Has not used any pain medication, but is now asking for IV medications. She does ask to have her membranes ruptured to help move things along.  FHTS: 145/mod var  A/P: expect SVD

## 2016-11-09 NOTE — Progress Notes (Signed)
Labor Progress Note  Kim Davidiffany Powell is a 26 y.o. G1P0 at 3067w2d  admitted for active labor  S: Patient seen and examined at bedside. Pain well-controlled with epidural.  O:  BP 131/72 (BP Location: Right Arm)   Pulse (!) 109   Temp 97.6 F (36.4 C) (Oral)   Resp 16   Ht 5\' 3"  (1.6 m)   Wt 79.8 kg (176 lb)   LMP 02/08/2016 (LMP Unknown)   SpO2 100%   BMI 31.18 kg/m   FHT: 130 bmp, mod variability, accels, no decels UC:   Contractions q3-3518m SVE:   Dilation: 6 Effacement (%): 100 Station: -1 Exam by:: Foley,rn AROM @0600   Pitocin @ 2 mu/min  Labs: Lab Results  Component Value Date   WBC 14.3 (H) 11/08/2016   HGB 12.1 11/08/2016   HCT 34.7 (L) 11/08/2016   MCV 86.1 11/08/2016   PLT 290 11/08/2016    Assessment / Plan: [age] y.o. G1P0 7367w2d  in early/active labor Augmentation of labor, progressing well  Labor: Progressing on Pitocin Fetal Wellbeing:  Category I Pain Control:  Epidural Anticipated MOD:  NSVD  Expectant management  Howard PouchLauren Juluis Fitzsimmons, MD PGY-1 [facility]

## 2016-11-09 NOTE — Anesthesia Postprocedure Evaluation (Signed)
Anesthesia Post Note  Patient: Kim Powell  Procedure(s) Performed: * No procedures listed *     Patient location during evaluation: Mother Baby Anesthesia Type: Epidural Level of consciousness: awake and alert and oriented Pain management: satisfactory to patient Vital Signs Assessment: post-procedure vital signs reviewed and stable Respiratory status: spontaneous breathing and nonlabored ventilation Cardiovascular status: stable Postop Assessment: no headache, no backache, no signs of nausea or vomiting, adequate PO intake and patient able to bend at knees (patient up walking) Anesthetic complications: no    Last Vitals:  Vitals:   11/09/16 1800 11/09/16 1844  BP: 124/80 119/77  Pulse: 81 88  Resp: 18 18  Temp: 36.9 C 36.8 C    Last Pain:  Vitals:   11/09/16 1844  TempSrc: Oral  PainSc:    Pain Goal:                 Madison HickmanGREGORY,Giulian Goldring

## 2016-11-09 NOTE — Anesthesia Preprocedure Evaluation (Signed)
Anesthesia Evaluation  Patient identified by MRN, date of birth, ID band Patient awake    Reviewed: Allergy & Precautions, NPO status , Patient's Chart, lab work & pertinent test results  Airway Mallampati: II  TM Distance: >3 FB Neck ROM: Full    Dental no notable dental hx. (+) Dental Advisory Given   Pulmonary asthma , former smoker,    Pulmonary exam normal        Cardiovascular negative cardio ROS Normal cardiovascular exam     Neuro/Psych negative neurological ROS  negative psych ROS   GI/Hepatic negative GI ROS, Neg liver ROS,   Endo/Other  negative endocrine ROS  Renal/GU negative Renal ROS  negative genitourinary   Musculoskeletal negative musculoskeletal ROS (+)   Abdominal   Peds negative pediatric ROS (+)  Hematology negative hematology ROS (+)   Anesthesia Other Findings   Reproductive/Obstetrics (+) Pregnancy                             Anesthesia Physical Anesthesia Plan  ASA: II  Anesthesia Plan: Epidural   Post-op Pain Management:    Induction:   PONV Risk Score and Plan:   Airway Management Planned: Simple Face Mask  Additional Equipment:   Intra-op Plan:   Post-operative Plan:   Informed Consent: I have reviewed the patients History and Physical, chart, labs and discussed the procedure including the risks, benefits and alternatives for the proposed anesthesia with the patient or authorized representative who has indicated his/her understanding and acceptance.   Dental advisory given  Plan Discussed with: Anesthesiologist  Anesthesia Plan Comments:         Anesthesia Quick Evaluation

## 2016-11-09 NOTE — Anesthesia Procedure Notes (Signed)
Epidural Patient location during procedure: OB Start time: 11/09/2016 9:06 AM End time: 11/09/2016 9:25 AM  Staffing Anesthesiologist: Heather RobertsSINGER, Jaidee Stipe Performed: anesthesiologist   Preanesthetic Checklist Completed: patient identified, site marked, pre-op evaluation, timeout performed, IV checked, risks and benefits discussed and monitors and equipment checked  Epidural Patient position: sitting Prep: DuraPrep Patient monitoring: heart rate, cardiac monitor, continuous pulse ox and blood pressure Approach: midline Location: L2-L3 Injection technique: LOR saline  Needle:  Needle type: Tuohy  Needle gauge: 17 G Needle length: 9 cm Needle insertion depth: 7 cm Catheter size: 20 Guage Catheter at skin depth: 12 cm Test dose: negative and Other  Assessment Events: blood not aspirated, injection not painful, no injection resistance and negative IV test  Additional Notes Informed consent obtained prior to proceeding including risk of failure, 1% risk of PDPH, risk of minor discomfort and bruising.  Discussed rare but serious complications including epidural abscess, permanent nerve injury, epidural hematoma.  Discussed alternatives to epidural analgesia and patient desires to proceed.  Timeout performed pre-procedure verifying patient name, procedure, and platelet count.  Patient tolerated procedure well.

## 2016-11-09 NOTE — Anesthesia Pain Management Evaluation Note (Signed)
  CRNA Pain Management Visit Note  Patient: Kim Powell, 26 y.o., female  "Hello I am a [facility] the [facility]. We have an anesthesia team available at all times to provide care throughout the hospital, including epidural management and anesthesia for C-section. I don't know your plan for the delivery whether it a natural birth, water birth, IV sedation, nitrous supplementation, doula or epidural, but we want to meet your pain goals."   1.Was your pain managed to your expectations on prior hospitalizations?   yes  2.What is your expectation for pain management during this hospitalization?     IV pain meds  3.How can we help you reach that goal? IV pain meds  Record the patient's initial score and the patient's pain goal.   Pain: 0/10  Pain Goal: 0/10 [facility] wants you to be able to say your pain was always managed very well.  Kim Powell, Kim Powell 11/09/2016

## 2016-11-10 ENCOUNTER — Encounter: Payer: PRIVATE HEALTH INSURANCE | Admitting: Family Medicine

## 2016-11-10 NOTE — Progress Notes (Signed)
Post Partum Day #1 Subjective: no complaints, up ad lib, voiding, tolerating PO and reports normal lochia  Objective: Blood pressure 135/85, pulse 94, temperature 98.1 F (36.7 C), temperature source Oral, resp. rate 18, height 5\' 3"  (1.6 m), weight 79.8 kg (176 lb), last menstrual period 02/08/2016, SpO2 100 %, unknown if currently breastfeeding.  Physical Exam:  General: alert Lochia: appropriate Uterine Fundus: firm DVT Evaluation: No evidence of DVT seen on physical exam.   Recent Labs  11/08/16 2138  HGB 12.1  HCT 34.7*    Assessment/Plan: Plan for discharge tomorrow  Breast feeding   LOS: 2 days   Allie BossierMyra C Temeca Somma 11/10/2016, 6:34 AM

## 2016-11-10 NOTE — Lactation Note (Signed)
This note was copied from a baby's chart. Lactation Consultation Note  Patient Name: Kim Powell [id number]'s Date: 11/10/2016 Reason for consult: Initial assessment Baby at 23 hr of life. Mom desires to ebf but reports baby has been very sleepy. Dad stated baby will latch but does not suck. There was formula at the bedside but parents would rather latch baby. Grandmother is present and pushing bottles because "the poor baby is hungry and bf is too hard". Discussed baby behavior, feeding frequency, baby belly size, voids, wt loss, breast changes, and nipple care. Demonstrated manual expression, colostrum noted bilaterally, spoon and curved tip syringe at the bedside. Given lactation handouts. Aware of OP services and support group. Showed parents how to notice hunger cues. Mom stated baby latched better in cross cradle. He would latch for a 3-4 sucks on the R breast but then would come off. Applied #20 NS and baby was able to maintain long bursts of sucking of with multiple swallows. Baby has a nice gape, flanges lips well, has good lateralization of tongue, has nice peristolic tongue movement when sucking on a gloved finger. Baby has a noticeable lingual frenulum with a posterior insertion point and has what looks like a rippled gum line. Baby's gums and frenulum do not appear to impacted his ability to bf. Mom will latch the baby on demand 8+/24hr, post express, and offer expressed milk per volume guidelines.     Maternal Data Has patient been taught Hand Expression?: Yes Does the patient have breastfeeding experience prior to this delivery?: No  Feeding Feeding Type: Breast Fed Length of feed: 20 min  LATCH Score/Interventions Latch: Repeated attempts needed to sustain latch, nipple held in mouth throughout feeding, stimulation needed to elicit sucking reflex. Intervention(s): Skin to skin;Teach feeding cues;Waking techniques Intervention(s): Adjust position;Assist with latch;Breast  compression  Audible Swallowing: Spontaneous and intermittent  Type of Nipple: Everted at rest and after stimulation (short shaft)  Comfort (Breast/Nipple): Soft / non-tender     Hold (Positioning): Full assist, staff holds infant at breast Intervention(s): Position options;Support Pillows  LATCH Score: 7  Lactation Tools Discussed/Used Tools: Nipple Shields Nipple shield size: 20 WIC Program: No (plans to sugn up tomorrow ) Pump Review: Setup, frequency, and cleaning;Milk Storage Initiated by:: ES Date initiated:: 11/10/16   Consult Status Consult Status: Follow-up Date: 11/11/16 Follow-up type: In-patient    [name] 11/10/2016, 3:58 PM

## 2016-11-10 NOTE — Clinical Social Work Maternal (Signed)
  CLINICAL SOCIAL WORK MATERNAL/CHILD NOTE  Patient Details  Name: Kim Powell MRN: [id number] Date of Birth: [birth year]  Date:  11/10/2016  Clinical Social Worker Initiating Note:  Laurey Arrow Date/ Time Initiated:  11/10/16/1129     Child's Name:  [name]   Legal Guardian:  Mother (FOB is Ambrose Pancoast 06/19/1992)   Need for Interpreter:  None   Date of Referral:  11/09/16     Reason for Referral:  Current Substance Use/Substance Use During Pregnancy  (hx of THC use. )   Referral Source:  [facility]   Address:  [address] [place] [address]  Phone number:  [phone]   Household Members:  Self, Significant Other (FOB's father and  and younger 2 siblings resides with MOB and FOB.)   Natural Supports (not living in the home):  Parent, Immediate Family   Professional Supports: None   Employment: Unemployed   Type of Work:     Education:  Chiropractor Resources:  Medicaid (CSW provided MOB with information to apply for [employer] and Medicaid. )   Other Resources:      Cultural/Religious Considerations Which May Impact Care:  None reported  Strengths:  Ability to meet basic needs , Pediatrician chosen , Home prepared for child    Risk Factors/Current Problems:  Substance Use    Cognitive State:  Able to Concentrate , Alert , Linear Thinking , Insightful    Mood/Affect:  Bright , Happy , Interested , Comfortable    CSW Assessment: CSW met with MOB to complete an assessment for Late PNC and hx of THC use.  When CSW arrived, MOB was resting in bed, infant was asleep in bassinet, and FOB was resting on couch. With MOB's permission, CSW asked FOB to leave the room in effort to meet with MOB in private.  During the assessment MOB was polite, honest, and insightful.   When CSW inquired about MOB's late [place], MOB reported that MOB initiated [facility] at 5 weeks while residing in [place].  MOB communicated that [facility] visits  were regular and continued to be consistent when MOB relocated to [place].  MOB denied any barriers to follow-up appointments for MOB and infant.   CSW asked about MOB substance use and MOB openly reported the use of marijuana throughout pregnancy.  MOB denied the use of all other illicit substance. MOB reports utilizing marijuana to decrease MOB's nausea and vomiting.  MOB reports last use was approximately 2 weeks ago. CSW informed MOB of hospital's SA policy and MOB was understanding. MOB was made aware that CSW will monitor infant's UDS and CDS and will make a report to [facility] if needed. CSW offered MOB resources for SA and MOB declined.   CSW provided SIDS and PPD education. MOB denied having questions regarding both. MOB reports having all necessary items for infant and feeling prepared to parent. CSW thanked MOB for meeting with CSW and provided MOB with CSW's contact information.   CSW Plan/Description:  Information/Referral to [employer] , No Further Intervention Required/No Barriers to Discharge, Patient/Family Education  (CSW will monitor infant's UDS and CDS and will make a report if warranted )   Laurey Arrow, MSW, LCSW Clinical Social Work [phone]  Dimple Nanas, [place] 11/10/2016, 11:35 AM

## 2016-11-10 NOTE — Progress Notes (Signed)
  CLINICAL SOCIAL WORK MATERNAL/CHILD NOTE  Patient Details  Name: Kim Powell MRN: [id number] Date of Birth: [birth year]  Date:  11/10/2016  Clinical Social Worker Initiating Note:  Laurey Arrow Date/ Time Initiated:  11/10/16/1129     Child's Name:  Kim Powell   Legal Guardian:  Mother (FOB is Ambrose Pancoast 06/19/1992)   Need for Interpreter:  None   Date of Referral:  11/09/16     Reason for Referral:  Current Substance Use/Substance Use During Pregnancy  (hx of THC use. )   Referral Source:  [facility]   Address:  [address] [place] [address]  Phone number:  [phone]   Household Members:  Self, Significant Other (FOB's father and  and younger 2 siblings resides with MOB and FOB.)   Natural Supports (not living in the home):  Parent, Immediate Family   Professional Supports: None   Employment: Unemployed   Type of Work:     Education:  Chiropractor Resources:  Medicaid (CSW provided MOB with information to apply for [employer] and Medicaid. )   Other Resources:      Cultural/Religious Considerations Which May Impact Care:  None reported  Strengths:  Ability to meet basic needs , Pediatrician chosen , Home prepared for child    Risk Factors/Current Problems:  Substance Use    Cognitive State:  Able to Concentrate , Alert , Linear Thinking , Insightful    Mood/Affect:  Bright , Happy , Interested , Comfortable    CSW Assessment: CSW met with MOB to complete an assessment for Late PNC and hx of THC use.  When CSW arrived, MOB was resting in bed, infant was asleep in bassinet, and FOB was resting on couch. With MOB's permission, CSW asked FOB to leave the room in effort to meet with MOB in private.  During the assessment MOB was polite, honest, and insightful.   When CSW inquired about MOB's late [place], MOB reported that MOB initiated [facility] at 5 weeks while residing in [place].  MOB communicated that [facility] visits  were regular and continued to be consistent when MOB relocated to [place].  MOB denied any barriers to follow-up appointments for MOB and infant.   CSW asked about MOB substance use and MOB openly reported the use of marijuana throughout pregnancy.  MOB denied the use of all other illicit substance. MOB reports utilizing marijuana to decrease MOB's nausea and vomiting.  MOB reports last use was approximately 2 weeks ago. CSW informed MOB of hospital's SA policy and MOB was understanding. MOB was made aware that CSW will monitor infant's UDS and CDS and will make a report to [facility] if needed. CSW offered MOB resources for SA and MOB declined.   CSW provided SIDS and PPD education. MOB denied having questions regarding both. MOB reports having all necessary items for infant and feeling prepared to parent. CSW thanked MOB for meeting with CSW and provided MOB with CSW's contact information.   CSW Plan/Description:  Information/Referral to [employer] , No Further Intervention Required/No Barriers to Discharge, Patient/Family Education  (CSW will monitor infant's UDS and CDS and will make a report if warranted )   Laurey Arrow, MSW, LCSW Clinical Social Work [phone]  Dimple Nanas, [place] 11/10/2016, 11:35 AM

## 2016-11-11 MED ORDER — IBUPROFEN 600 MG PO TABS: 600.0000 mg | ORAL_TABLET | Freq: Four times a day (QID) | ORAL | 0 refills | Status: DC

## 2016-11-11 NOTE — Discharge Summary (Signed)
OB Discharge Summary  Patient Name: Kim Powell DOB: [birth year] MRN: [id number]  Date of admission: 11/08/2016 Delivering MD: Howard Pouch   Date of discharge: 11/11/2016  Admitting diagnosis: 39.3WKS CTX   Intrauterine pregnancy: [redacted]w[redacted]d     Secondary diagnosis:Active Problems:   Indication for care in labor and delivery, antepartum      Discharge diagnosis: Term Pregnancy Delivered                                                                      Augmentation: AROM and Pitocin  Complications: None  Hospital course:  Onset of Labor With Vaginal Delivery     26 y.o. yo G1P1001 at [redacted]w[redacted]d was admitted in Latent Labor on 11/08/2016. Patient had an uncomplicated labor course as follows:  Membrane Rupture Time/Date: 5:56 AM ,11/09/2016   Intrapartum Procedures: Episiotomy: None [1]                                         Lacerations:  Labial [10]  Patient had a delivery of a Viable infant. 11/09/2016  Information for the patient's newborn:  Laquashia, Mergenthaler [[id number]]  Delivery Method: Vag-Spont    Pateint had an uncomplicated postpartum course.  She is ambulating, tolerating a regular diet, passing flatus, and urinating well. Patient is discharged home in stable condition on 11/11/16.   Physical exam  Vitals:   11/09/16 2230 11/10/16 0650 11/10/16 1850 11/11/16 0555  BP: 135/85 109/82 127/79 125/83  Pulse: 94 85 79 82  Resp: 18 18 18 20   Temp: 98.1 F (36.7 C) 98.1 F (36.7 C) 98.2 F (36.8 C) 98.6 F (37 C)  TempSrc: Oral Oral Oral Oral  SpO2:      Weight:      Height:       General: alert Lochia: appropriate Uterine Fundus: firm Incision: N/A DVT Evaluation: No evidence of DVT seen on physical exam. Labs: Lab Results  Component Value Date   WBC 14.3 (H) 11/08/2016   HGB 12.1 11/08/2016   HCT 34.7 (L) 11/08/2016   MCV 86.1 11/08/2016   PLT 290 11/08/2016   CMP Latest Ref Rng & Units 11/08/2016  Glucose 65 - 99 mg/dL 70  BUN 6 - 20 mg/dL 8  Creatinine  7.82 - 9.56 mg/dL 2.13  Sodium 086 - 578 mmol/L 135  Potassium 3.5 - 5.1 mmol/L 3.7  Chloride 101 - 111 mmol/L 106  CO2 22 - 32 mmol/L 18(L)  Calcium 8.9 - 10.3 mg/dL 9.1  Total Protein 6.5 - 8.1 g/dL 7.4  Total Bilirubin 0.3 - 1.2 mg/dL 0.3  Alkaline Phos 38 - 126 U/L 199(H)  AST 15 - 41 U/L 21  ALT 14 - 54 U/L 12(L)    Discharge instruction: per After Visit Summary and "Baby and Me Booklet".  After Visit Meds:  Allergies as of 11/11/2016   No Known Allergies     Medication List    TAKE these medications   albuterol 108 (90 Base) MCG/ACT inhaler Commonly known as:  PROVENTIL HFA;VENTOLIN HFA Inhale 2 puffs into the lungs every 6 (six) hours as needed for wheezing or shortness  of breath.   ibuprofen 600 MG tablet Commonly known as:  ADVIL,MOTRIN Take 1 tablet (600 mg total) by mouth every 6 (six) hours.   prenatal multivitamin Tabs tablet Take 1 tablet by mouth daily at 12 noon.       Diet: routine diet  Activity: Advance as tolerated. Pelvic rest for 6 weeks.   Outpatient follow up:4 weeks Follow up Appt:Future Appointments Date Time Provider Department Center  12/22/2016 3:20 PM Donette LarryBhambri, Melanie, CNM [facility]   Follow up visit: No Follow-up on file.  Postpartum contraception: Considering IUD  Newborn Data: Live born female  Birth Weight: 7 lb 14.7 oz (3592 g) APGAR: 9, 9  Baby Feeding: Bottle and and pumping Disposition:home with mother   11/11/2016 Allie BossierMyra C Flavia Bruss, MD

## 2016-11-11 NOTE — Discharge Instructions (Signed)

## 2016-11-11 NOTE — Progress Notes (Signed)
Follow up care, prescriptions and home care reviewed with patient. Denied questions and concerns. Teach back for mom and baby care and when to call.

## 2016-11-11 NOTE — Lactation Note (Signed)
This note was copied from a baby's chart. Lactation Consultation Note  Patient Name: Kim Powell: 11/11/2016  Mom is packed for discharge and baby in the car seat.  Mom states baby is latching well with nipple shield but she is supplementing with formula because baby still hungry.  Mom states her breasts are feeling full this AM.  Discussed milk coming to volume.  Recommended weaning off of formula as milk is coming in in order to establish a good milk supply.  Lactation outpatient services and support information reviewed and encouraged.  No questions at present.   Maternal Data    Feeding Feeding Type: Bottle Fed - Formula Nipple Type: Slow - flow  LATCH Score/Interventions                      Lactation Tools Discussed/Used     Consult Status      Huston FoleyMOULDEN, [name] 11/11/2016, 10:38 AM

## 2016-11-11 NOTE — Progress Notes (Signed)
CSW [place] [facility]ovey(Pam Miller), [facility].  CPS will follow-up with MOB and family after d/c.  There are no barriers to d/c.  Blaine HamperAngel Boyd-Gilyard, MSW, LCSW Clinical Social Work [phone]

## 2016-12-22 ENCOUNTER — Other Ambulatory Visit (HOSPITAL_COMMUNITY): Payer: Self-pay | Admitting: Obstetrics & Gynecology

## 2016-12-22 ENCOUNTER — Ambulatory Visit (INDEPENDENT_AMBULATORY_CARE_PROVIDER_SITE_OTHER): Payer: BLUE CROSS/BLUE SHIELD | Admitting: Certified Nurse Midwife

## 2016-12-22 ENCOUNTER — Encounter: Payer: Self-pay | Admitting: Certified Nurse Midwife

## 2016-12-22 DIAGNOSIS — Z30011 Encounter for initial prescription of contraceptive pills: Secondary | ICD-10-CM

## 2016-12-22 DIAGNOSIS — Z3009 Encounter for other general counseling and advice on contraception: Secondary | ICD-10-CM

## 2016-12-22 MED ORDER — NORETHINDRONE 0.35 MG PO TABS: 1.0000 | ORAL_TABLET | Freq: Every day | ORAL | 11 refills | Status: DC

## 2016-12-22 NOTE — Progress Notes (Signed)
Subjective:     Kim Powell is a 26 y.o. female who presents for a postpartum visit. She is 6 weeks postpartum following a spontaneous vaginal delivery. I have fully reviewed the prenatal and intrapartum course. The delivery was at 39 gestational weeks. Outcome: spontaneous vaginal delivery. Anesthesia: epidural. Postpartum course has been uncomplicated. Baby's course has been umcomplicated. Baby is feeding by breast. Having some breast engorgement and tenderness. Bleeding no bleeding. Bowel function is normal. Bladder function is normal. Patient is not sexually active. Contraception method is none. Postpartum depression screening: negative.  The following portions of the patient's history were reviewed and updated as appropriate: allergies, current medications, past family history, past medical history, past social history, past surgical history and problem list.  Review of Systems Pertinent items are noted in HPI.   Objective:   BP 128/85   Pulse 88   Ht 5\' 3"  (1.6 m)   Wt 149 lb 3.2 oz (67.7 kg)   BMI 26.43 kg/m   General:  alert, cooperative and appears stated age   Breasts:  ino nipple discharge or bleeding; engorged breast ducts on L breast w/o erythema or increase in warmth  Lungs: clear to auscultation bilaterally  Heart:  regular rate and rhythm, no murmur  Abdomen: Soft and nontender  Neuro:  Alert and oriented. Grossly normal  Psych: Normal mood and affect        Assessment:  26 y.o. now G1P1001 s/p NSVD.  Normal postpartum exam. Pap smear not done at today's visit (last pap 03/2016, normal)  Plan:   1. Contraception: oral progesterone-only contraceptive. Counseled on contraceptive methods, especially LARCs. She would like to do progesterone-pills only for now, but will think about Nexplanon (reading information given). Advised about taking pills at same time everyday and using back up contraception if any dose missed/late; also recommended switching to other OCP or  contraceptive method once no longer breastfeeding.  2. Counseled on breast concerns; using warm compress and expressing milk. 3. Follow up as needed.

## 2016-12-22 NOTE — Patient Instructions (Signed)
Etonogestrel implant What is this medicine? ETONOGESTREL (et oh noe JES trel) is a contraceptive (birth control) device. It is used to prevent pregnancy. It can be used for up to 3 years. This medicine may be used for other purposes; ask your health care provider or pharmacist if you have questions. COMMON BRAND NAME(S): Implanon, Nexplanon What should I tell my health care provider before I take this medicine? They need to know if you have any of these conditions: -abnormal vaginal bleeding -blood vessel disease or blood clots -cancer of the breast, cervix, or liver -depression -diabetes -gallbladder disease -headaches -heart disease or recent heart attack -high blood pressure -high cholesterol -kidney disease -liver disease -renal disease -seizures -tobacco smoker -an unusual or allergic reaction to etonogestrel, other hormones, anesthetics or antiseptics, medicines, foods, dyes, or preservatives -pregnant or trying to get pregnant -breast-feeding How should I use this medicine? This device is inserted just under the skin on the inner side of your upper arm by a health care professional. Talk to your pediatrician regarding the use of this medicine in children. Special care may be needed. Overdosage: If you think you have taken too much of this medicine contact a poison control center or emergency room at once. NOTE: This medicine is only for you. Do not share this medicine with others. What if I miss a dose? This does not apply. What may interact with this medicine? Do not take this medicine with any of the following medications: -amprenavir -bosentan -fosamprenavir This medicine may also interact with the following medications: -barbiturate medicines for inducing sleep or treating seizures -certain medicines for fungal infections like ketoconazole and itraconazole -grapefruit juice -griseofulvin -medicines to treat seizures like carbamazepine, felbamate, oxcarbazepine,  phenytoin, topiramate -modafinil -phenylbutazone -rifampin -rufinamide -some medicines to treat HIV infection like atazanavir, indinavir, lopinavir, nelfinavir, tipranavir, ritonavir -St. John's wort This list may not describe all possible interactions. Give your health care provider a list of all the medicines, herbs, non-prescription drugs, or dietary supplements you use. Also tell them if you smoke, drink alcohol, or use illegal drugs. Some items may interact with your medicine. What should I watch for while using this medicine? This product does not protect you against HIV infection (AIDS) or other sexually transmitted diseases. You should be able to feel the implant by pressing your fingertips over the skin where it was inserted. Contact your doctor if you cannot feel the implant, and use a non-hormonal birth control method (such as condoms) until your doctor confirms that the implant is in place. If you feel that the implant may have broken or become bent while in your arm, contact your healthcare provider. What side effects may I notice from receiving this medicine? Side effects that you should report to your doctor or health care professional as soon as possible: -allergic reactions like skin rash, itching or hives, swelling of the face, lips, or tongue -breast lumps -changes in emotions or moods -depressed mood -heavy or prolonged menstrual bleeding -pain, irritation, swelling, or bruising at the insertion site -scar at site of insertion -signs of infection at the insertion site such as fever, and skin redness, pain or discharge -signs of pregnancy -signs and symptoms of a blood clot such as breathing problems; changes in vision; chest pain; severe, sudden headache; pain, swelling, warmth in the leg; trouble speaking; sudden numbness or weakness of the face, arm or leg -signs and symptoms of liver injury like dark yellow or brown urine; general ill feeling or flu-like symptoms;  light-colored   stools; loss of appetite; nausea; right upper belly pain; unusually weak or tired; yellowing of the eyes or skin -unusual vaginal bleeding, discharge -signs and symptoms of a stroke like changes in vision; confusion; trouble speaking or understanding; severe headaches; sudden numbness or weakness of the face, arm or leg; trouble walking; dizziness; loss of balance or coordination Side effects that usually do not require medical attention (report to your doctor or health care professional if they continue or are bothersome): -acne -back pain -breast pain -changes in weight -dizziness -general ill feeling or flu-like symptoms -headache -irregular menstrual bleeding -nausea -sore throat -vaginal irritation or inflammation This list may not describe all possible side effects. Call your doctor for medical advice about side effects. You may report side effects to FDA at 1-800-FDA-1088. Where should I keep my medicine? This drug is given in a hospital or clinic and will not be stored at home. NOTE: This sheet is a summary. It may not cover all possible information. If you have questions about this medicine, talk to your doctor, pharmacist, or health care provider.  2018 Elsevier/Gold Standard (2015-12-11 11:19:22)  

## 2017-01-05 ENCOUNTER — Other Ambulatory Visit (HOSPITAL_COMMUNITY): Payer: Self-pay | Admitting: Obstetrics & Gynecology

## 2017-01-06 ENCOUNTER — Other Ambulatory Visit (HOSPITAL_COMMUNITY): Payer: Self-pay | Admitting: Obstetrics & Gynecology

## 2017-01-09 ENCOUNTER — Other Ambulatory Visit (HOSPITAL_COMMUNITY): Payer: Self-pay | Admitting: Obstetrics & Gynecology

## 2017-01-19 ENCOUNTER — Telehealth: Payer: Self-pay | Admitting: *Deleted

## 2017-01-19 NOTE — Telephone Encounter (Addendum)
Called pt to discuss refill request for ibuprofen.  She states that she is still having the same pain in her Lt hip and back that she had while pregnant. I asked pt why she did not discuss with the doctor @ her PP visit on 7/18 and she stated that she did report it. Per chart review, refill of ibuprofen was sent to pt's pharmacy yesterday by Dr. Marice Potterove. I advised pt to use heating pad to her hip and back and to pick up medication refill. I also stated that I will discuss her concern with Dr. Adrian BlackwaterStinson to see if he can see her regarding the pain. Pt voiced understanding.   8/22 1150  Called pt and left message stating that I have received information from Dr. Adrian BlackwaterStinson and need to discuss with her. Please call back and state whether a detailed message can be left on her voicemail. *Per Dr. Adrian BlackwaterStinson, pt should be referred to Terrilee FilesZach Smith, DO or Gaspar BiddingMichael Rigby, DO if she is still having problems and desires treatment. Both are sports medicine doctors who perform manipulation.

## 2017-01-29 ENCOUNTER — Encounter: Payer: Self-pay | Admitting: Advanced Practice Midwife

## 2017-06-10 IMAGING — US US MFM OB COMP +14 WKS
1 series · 14 of 28 positions shown · non-contrast
Comparison: none

[Series 1: us mfm ob comp +14 wks · 66 acquisitions, 14 frames shown]
[im 3/66]
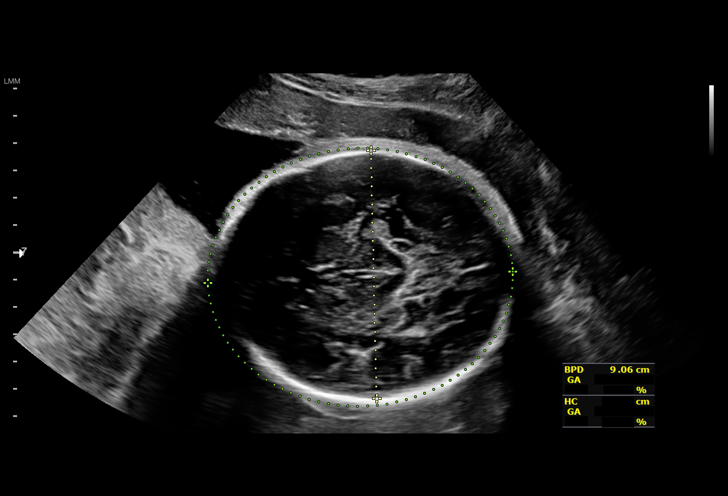
[im 8/66]
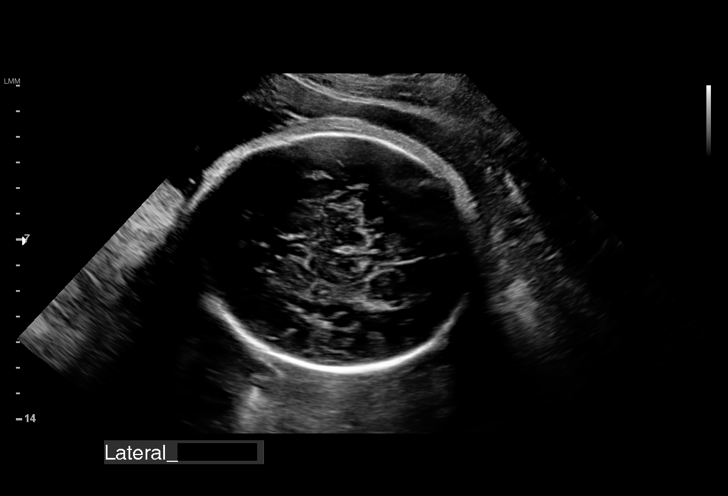
[im 13/66]
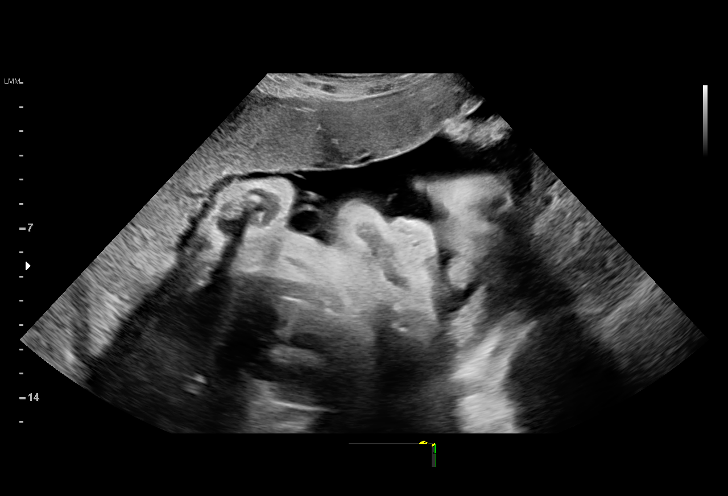
[im 17/66]
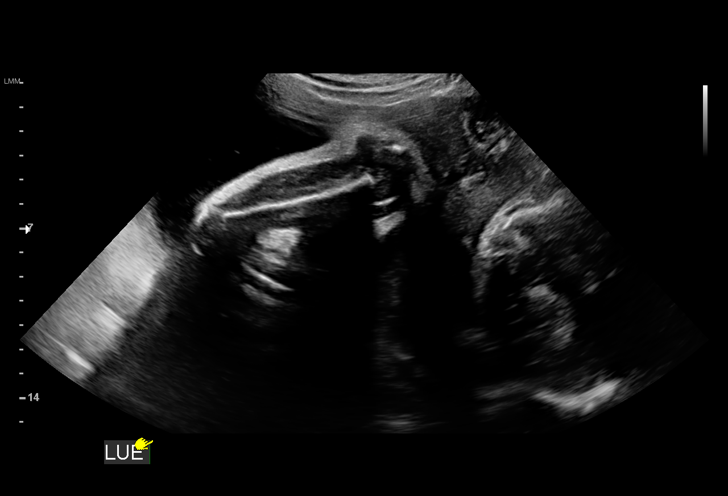
[im 22/66]
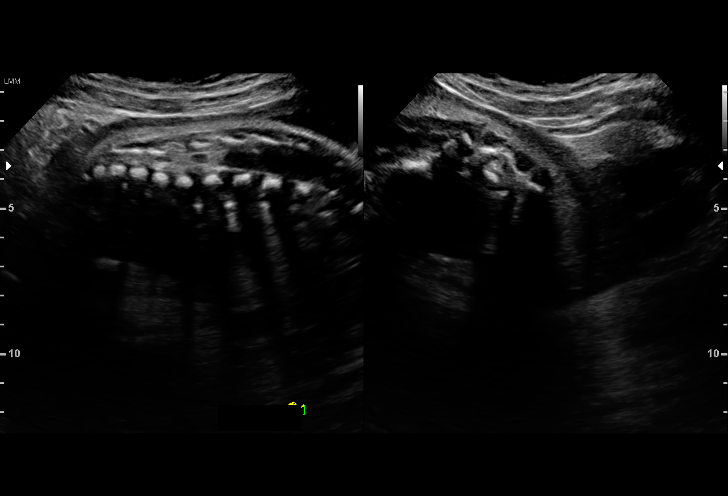
[im 27/66]
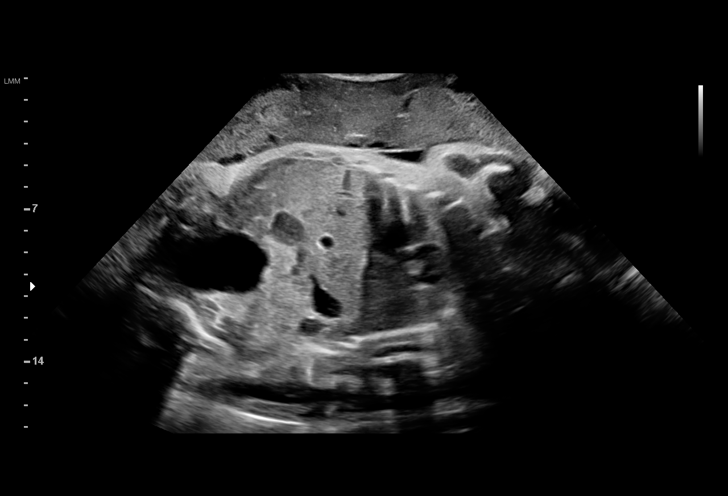
[im 32/66]
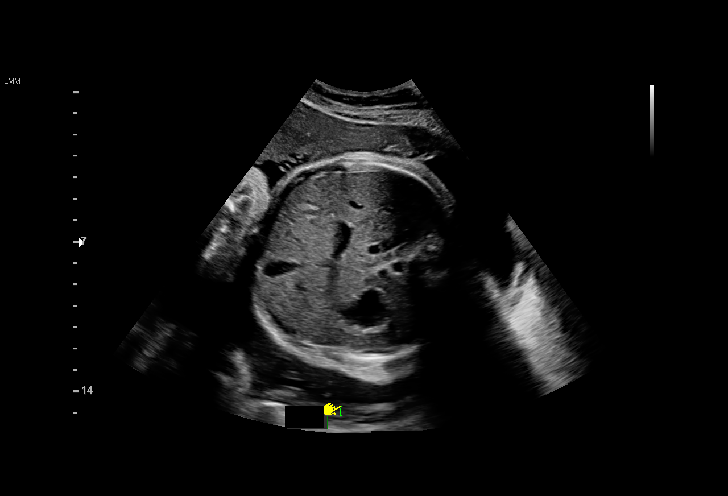
[im 37/66]
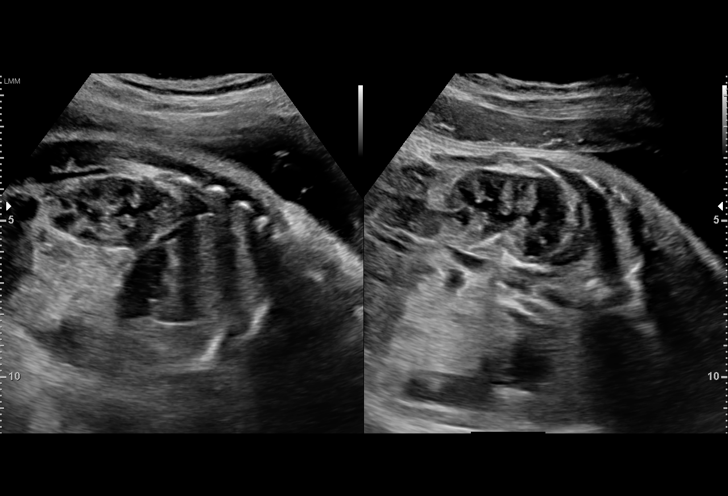
[im 41/66]
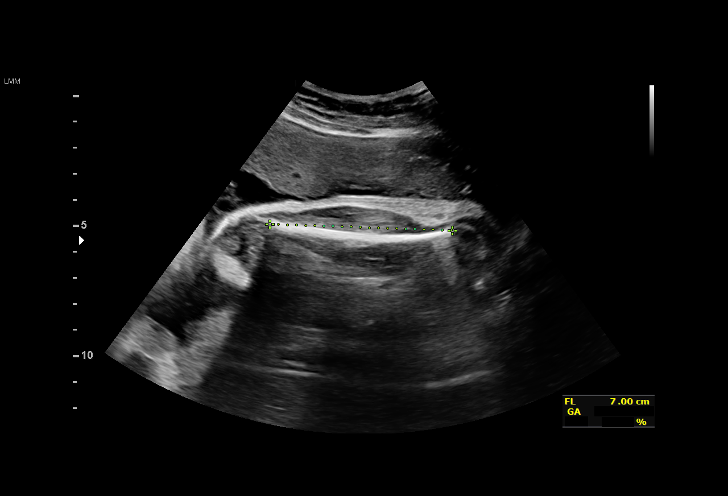
[im 46/66]
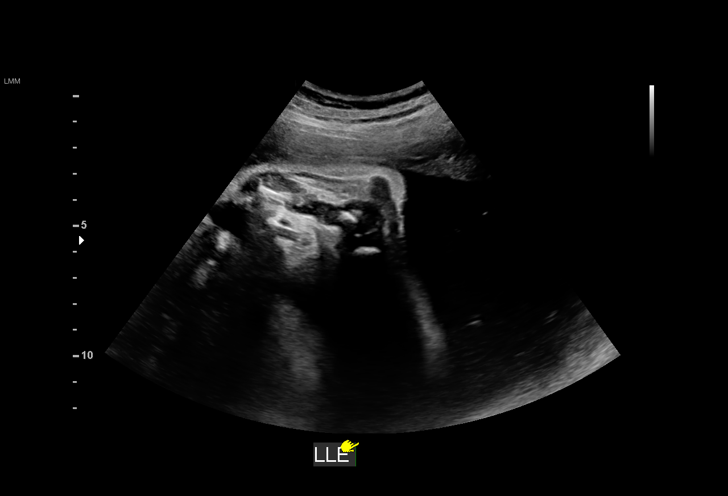
[im 51/66]
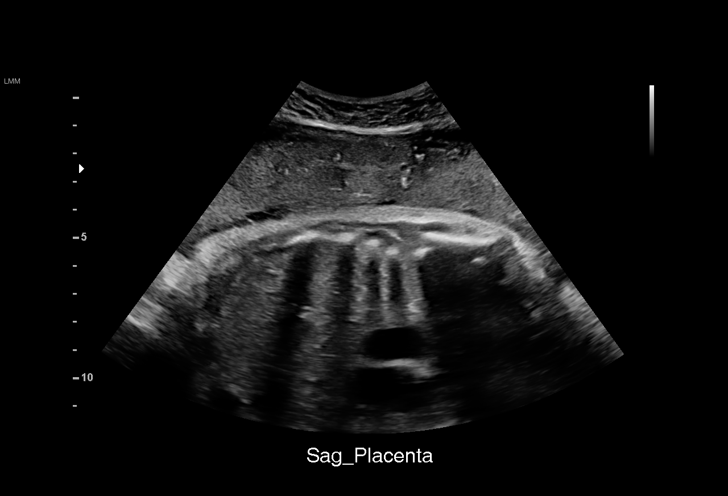
[im 56/66]
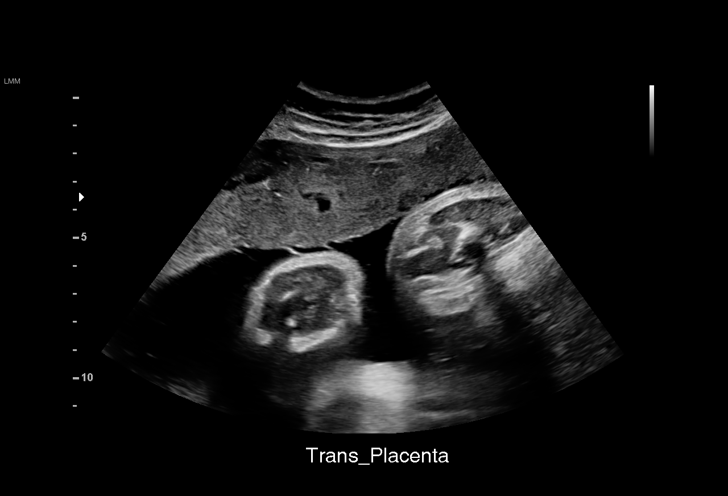
[im 61/66]
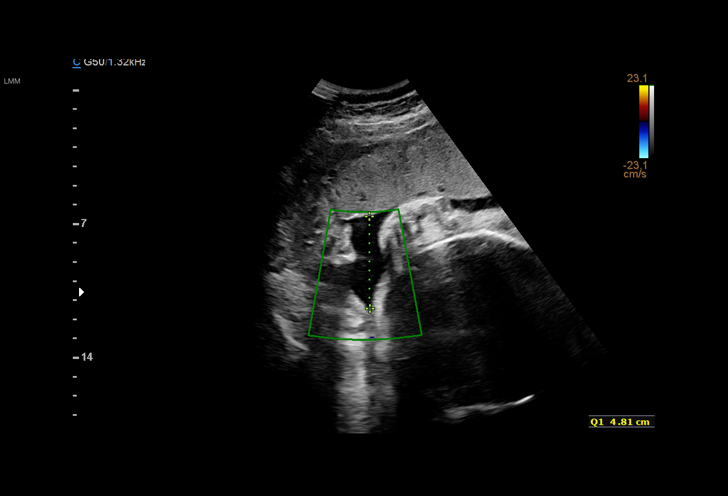
[im 66/66]
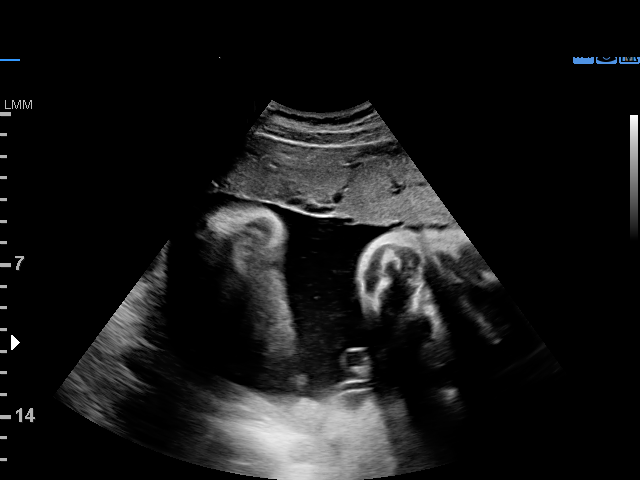

[14 of 28 positions shown; findings below may reference images not displayed]

OB/Gyn Clinic
[REDACTED]

1  DLEKE TIGER           [id number]      [id number]     [id number]
Indications

35 weeks gestation of pregnancy
Encounter for fetal anatomic survey
OB History

Gravidity:    1
Fetal Evaluation

Num Of Fetuses:     1
Fetal Heart         164
Rate(bpm):
Cardiac Activity:   Observed
Presentation:       Cephalic
Placenta:           Anterior, above cervical os
P. Cord Insertion:  Visualized, central

Amniotic Fluid
AFI FV:      Subjectively upper-normal

AFI Sum(cm)     %Tile       Largest Pocket(cm)
24.71           94
RUQ(cm)       RLQ(cm)       LUQ(cm)        LLQ(cm)
4.81
Biometry

BPD:      90.7  mm     G. Age:  36w 5d         83  %    CI:        78.59   %    70 - 86
FL/HC:      21.5   %    20.1 -
HC:      323.6  mm     G. Age:  36w 4d         40  %    HC/AC:      0.98        0.93 -
AC:      330.7  mm     G. Age:  37w 0d         88  %    FL/BPD:     76.8   %    71 - 87
FL:       69.7  mm     G. Age:  35w 5d         46  %    FL/AC:      21.1   %    20 - 24
HUM:      63.4  mm     G. Age:  36w 6d         83  %
CER:      43.8  mm     G. Age:  37w 6d         69  %
CM:          7  mm

Est. FW:    4813  gm      6 lb 9 oz     79  %
Gestational Age

LMP:           35w 5d        Date:  02/08/16                 EDD:   11/14/16
U/S Today:     36w 4d                                        EDD:   11/08/16
Best:          35w 5d     Det. By:  LMP  (02/08/16)          EDD:   11/14/16
Anatomy

Cranium:               Appears normal         Aortic Arch:            Not well visualized
Cavum:                 Appears normal         Ductal Arch:            Not well visualized
Ventricles:            Appears normal         Diaphragm:              Appears normal
Choroid Plexus:        Appears normal         Stomach:                Appears normal, left
sided
Cerebellum:            Appears normal         Abdomen:                Appears normal
Posterior Fossa:       Appears normal         Abdominal Wall:         Not well visualized
Face:                  Orbits appear          Cord Vessels:           Appears normal (3
normal                                         vessel cord)
Lips:                  Appears normal         Kidneys:                Appear normal
Thoracic:              Appears normal         Bladder:                Appears normal
Heart:                 Appears normal         Spine:                  Appears normal
(4CH, axis, and situs
RVOT:                  Not well visualized    Upper Extremities:      Visualized
LVOT:                  Not well visualized    Lower Extremities:      Visualized

Other:  Fetus appears to be a male. Technically difficult due to advanced GA
and fetal position.
Impression

Single IUP at 35w 5d
Limited views of the fetal heart obtained due to late
gestational age and fetal position
The estimated fetal weight is at the 79th %tile
Anterior placenta without previa
Normal amniotic fluid volume
Recommendations

Follow-up ultrasounds as clinically indicated.

## 2020-06-07 NOTE — L&D Delivery Note (Addendum)
OB/GYN Faculty Practice Delivery Note  Kim Powell is a 30 y.o. G2P1001 at [redacted]w[redacted]d admitted for SOL.   GBS Status: Positive/-- (07/18 0257) Maximum Maternal Temperature: 98.7  Labor course: Initial SVE: 1 cm. Augmentation with: AROM and Pitocin. She then progressed to complete.  ROM: 18h 91m with clear fluid at AROM and 0730 this AM, however, copious amounts of thick particulate meconium stained fluid after delivery  Birth: At 0854 a viable female was delivered via spontaneous vaginal delivery (Presentation: ROA). Nuchal cord present: Yes - 3 loose loops.  Shoulders and body delivered in usual fashion. Infant placed directly on mom's abdomen for bonding/skin-to-skin, baby dried and stimulated. Cord clamped x 2 after 1 minute and cut by FOB, Bernette Redbird.  Cord blood collected.  The placenta separated spontaneously and delivered via gentle cord traction.  Pitocin infused rapidly IV per protocol.  Fundus firm with massage.  Placenta inspected and appears to be intact with a 3 VC.  Placenta/Cord with the following complications: meconium staining.  Cord pH: n/a Sponge and instrument count were correct x2.  Intrapartum complications:  None Anesthesia:  epidural Episiotomy: none Lacerations:  none Suture Repair:  n/a EBL (mL): 10   Infant: APGAR (1 MIN): 8   APGAR (5 MINS): 9   Infant weight: pending  Mom to postpartum.  Baby to Couplet care / Skin to Skin. Placenta to L&D   Plans to Breastfeed Contraception:  undecided Circumcision: declines  Note sent to [facility]: [facility] for pp visit.  Raelyn Mora , MSN, CNM 01/30/2021 9:16 AM

## 2020-06-16 ENCOUNTER — Other Ambulatory Visit: Payer: Self-pay

## 2020-06-16 ENCOUNTER — Ambulatory Visit (INDEPENDENT_AMBULATORY_CARE_PROVIDER_SITE_OTHER): Payer: BLUE CROSS/BLUE SHIELD

## 2020-06-16 VITALS — BP 137/83 | HR 139 | Ht 63.0 in | Wt 180.9 lb

## 2020-06-16 DIAGNOSIS — Z3200 Encounter for pregnancy test, result unknown: Secondary | ICD-10-CM

## 2020-06-16 LAB — POCT URINE PREGNANCY: Preg Test, Ur: NEGATIVE

## 2020-06-16 NOTE — Progress Notes (Signed)
Patient was assessed and managed by nursing staff during this encounter. I have reviewed the chart and agree with the documentation and plan. I have also made any necessary editorial changes.  Catalina Antigua, MD 06/16/2020 11:48 AM

## 2020-06-16 NOTE — Progress Notes (Signed)
Pt present for UPT, LMP: 04/23/2020 Positive UPT Pt has no questions or concerns at this time

## 2020-07-07 ENCOUNTER — Ambulatory Visit (INDEPENDENT_AMBULATORY_CARE_PROVIDER_SITE_OTHER): Payer: BLUE CROSS/BLUE SHIELD

## 2020-07-07 ENCOUNTER — Ambulatory Visit (INDEPENDENT_AMBULATORY_CARE_PROVIDER_SITE_OTHER): Payer: Medicaid Other

## 2020-07-07 ENCOUNTER — Other Ambulatory Visit: Payer: Self-pay

## 2020-07-07 VITALS — BP 115/71 | HR 90 | Ht 63.0 in | Wt 186.3 lb

## 2020-07-07 DIAGNOSIS — O3680X0 Pregnancy with inconclusive fetal viability, not applicable or unspecified: Secondary | ICD-10-CM

## 2020-07-07 DIAGNOSIS — Z3491 Encounter for supervision of normal pregnancy, unspecified, first trimester: Secondary | ICD-10-CM

## 2020-07-07 DIAGNOSIS — Z789 Other specified health status: Secondary | ICD-10-CM | POA: Diagnosis not present

## 2020-07-07 DIAGNOSIS — O219 Vomiting of pregnancy, unspecified: Secondary | ICD-10-CM

## 2020-07-07 DIAGNOSIS — O283 Abnormal ultrasonic finding on antenatal screening of mother: Secondary | ICD-10-CM

## 2020-07-07 MED ORDER — DOXYLAMINE-PYRIDOXINE 10-10 MG PO TBEC: 2.0000 | DELAYED_RELEASE_TABLET | Freq: Every day | ORAL | 5 refills | Status: DC

## 2020-07-07 MED ORDER — BLOOD PRESSURE KIT DEVI: 1.0000 | 0 refills | Status: DC

## 2020-07-07 NOTE — Progress Notes (Signed)
PRENATAL INTAKE SUMMARY  Ms. Vanblarcom presents today New OB Nurse Interview.  OB History    Gravida  2   Para  1   Term  1   Preterm      AB      Living  1     SAB      IAB      Ectopic      Multiple  0   Live Births  1          I have reviewed the patient's medical, obstetrical, social, and family histories, medications, and available lab results.  SUBJECTIVE She has no unusual complaints  OBJECTIVE Initial Physical Exam (New OB)  GENERAL APPEARANCE: alert, well appearing   ASSESSMENT Normal pregnancy  PLAN Prenatal care to be completed at [place] OB labs to be completed at [facility] provider visit [place] ordered Blood pressure kit ordered U/S performed today reveals 65w6dby CRL. FHR 167. PHQ 2 score:0 GAD 7 score:1 Diclegis sent to the pharmacy for nausea

## 2020-07-07 NOTE — Progress Notes (Signed)
I reviewed the images done on 07/08/19 in the office and possible enlarged bladder noted.  No other abnormalities.  First trimester anatomy US ordered with MFM for follow up. Discussed results with pt at time of today's [place], pt to follow up with MFM.

## 2020-07-07 NOTE — Progress Notes (Signed)
Patient was assessed and managed by nursing staff during this encounter. I have reviewed the chart and agree with the documentation and plan. I have also made any necessary editorial changes.  Warden Fillers, MD 07/07/2020 1:12 PM

## 2020-07-14 ENCOUNTER — Other Ambulatory Visit (HOSPITAL_COMMUNITY)
Admission: RE | Admit: 2020-07-14 | Discharge: 2020-07-14 | Disposition: A | Discharge status: Home / Self Care | Payer: Medicaid Other | Source: Ambulatory Visit | Attending: Advanced Practice Midwife | Admitting: Advanced Practice Midwife

## 2020-07-14 ENCOUNTER — Encounter: Payer: Self-pay | Admitting: *Deleted

## 2020-07-14 ENCOUNTER — Ambulatory Visit (INDEPENDENT_AMBULATORY_CARE_PROVIDER_SITE_OTHER): Payer: Medicaid Other | Admitting: Advanced Practice Midwife

## 2020-07-14 ENCOUNTER — Other Ambulatory Visit: Payer: Self-pay | Admitting: *Deleted

## 2020-07-14 ENCOUNTER — Ambulatory Visit: Payer: Medicaid Other | Admitting: *Deleted

## 2020-07-14 ENCOUNTER — Other Ambulatory Visit: Payer: Self-pay

## 2020-07-14 ENCOUNTER — Ambulatory Visit: Payer: Medicaid Other | Attending: Advanced Practice Midwife

## 2020-07-14 VITALS — BP 111/63 | HR 92

## 2020-07-14 VITALS — BP 130/75 | HR 114 | Wt 179.8 lb

## 2020-07-14 DIAGNOSIS — O99211 Obesity complicating pregnancy, first trimester: Secondary | ICD-10-CM | POA: Diagnosis not present

## 2020-07-14 DIAGNOSIS — Z6831 Body mass index (BMI) 31.0-31.9, adult: Secondary | ICD-10-CM

## 2020-07-14 DIAGNOSIS — O219 Vomiting of pregnancy, unspecified: Secondary | ICD-10-CM

## 2020-07-14 DIAGNOSIS — O283 Abnormal ultrasonic finding on antenatal screening of mother: Secondary | ICD-10-CM | POA: Diagnosis present

## 2020-07-14 DIAGNOSIS — Z3481 Encounter for supervision of other normal pregnancy, first trimester: Secondary | ICD-10-CM

## 2020-07-14 DIAGNOSIS — L2082 Flexural eczema: Secondary | ICD-10-CM

## 2020-07-14 DIAGNOSIS — Z3A12 12 weeks gestation of pregnancy: Secondary | ICD-10-CM

## 2020-07-14 DIAGNOSIS — Z3A19 19 weeks gestation of pregnancy: Secondary | ICD-10-CM

## 2020-07-14 MED ORDER — TRIAMCINOLONE ACETONIDE 0.5 % EX OINT: 1.0000 "application " | TOPICAL_OINTMENT | Freq: Two times a day (BID) | CUTANEOUS | 0 refills | Status: DC

## 2020-07-14 MED ORDER — DOXYLAMINE-PYRIDOXINE 10-10 MG PO TBEC
2.0000 | DELAYED_RELEASE_TABLET | Freq: Every day | ORAL | 5 refills | Status: DC
Start: 2020-07-14 — End: 2021-01-31

## 2020-07-14 NOTE — Progress Notes (Signed)
Pt presents today for NOB visit. No complaints.

## 2020-07-14 NOTE — Progress Notes (Signed)
Subjective:   Kim Powell is a 30 y.o. G2P1001 at 70w6dby LMP being seen today for her first obstetrical visit.  Her obstetrical history is significant for NSVD at term x 1 and has Encounter for supervision of normal pregnancy in first trimester on their problem list.. Patient does intend to breast feed. Pregnancy history fully reviewed.  Patient reports nausea, vomiting and worsening eczema.  HISTORY: OB History  Gravida Para Term Preterm AB Living  2 1 1  0 0 1  SAB IAB Ectopic Multiple Live Births  0 0 0 0 1    # Outcome Date GA Lbr Len/2nd Weight Sex Delivery Anes PTL Lv  2 Current           1 Term 11/09/16 368w2d4:00 / 01:27 7 lb 14.7 oz (3.592 kg) M Vag-Spont EPI  LIV     Name: Coverdale,BOY Marilla     Apgar1: 9  Apgar5: 9   Past Medical History:  Diagnosis Date  . Asthma    Past Surgical History:  Procedure Laterality Date  . NO PAST SURGERIES     Family History  Problem Relation Age of Onset  . Heart attack Father   . Cancer Maternal Grandmother    Social History   Tobacco Use  . Smoking status: Never Smoker  . Smokeless tobacco: Never Used  Vaping Use  . Vaping Use: Never used  Substance Use Topics  . Alcohol use: Not Currently    Comment: Last drink in November  . Drug use: No   No Known Allergies Current Outpatient Medications on File Prior to Visit  Medication Sig Dispense Refill  . albuterol (PROVENTIL HFA;VENTOLIN HFA) 108 (90 Base) MCG/ACT inhaler Inhale 2 puffs into the lungs every 6 (six) hours as needed for wheezing or shortness of breath.    . Blood Pressure Monitoring (BLOOD PRESSURE KIT) DEVI 1 kit by Does not apply route once a week. 1 each 0  . Prenatal Vit-Fe Fumarate-FA (PRENATAL MULTIVITAMIN) TABS tablet Take 1 tablet by mouth daily at 12 noon.     No current facility-administered medications on file prior to visit.     Indications for ASA therapy (per uptodate) One of the following: Previous pregnancy with preeclampsia,  especially early onset and with an adverse outcome No Multifetal gestation No Chronic hypertension No Type 1 or 2 diabetes mellitus No Chronic kidney disease No Autoimmune disease (antiphospholipid syndrome, systemic lupus erythematosus) No   Two or more of the following: Nulliparity No Obesity (body mass index >30 kg/m2) Yes Family history of preeclampsia in mother or sister No Age ?35 years No Sociodemographic characteristics (African American race, low socioeconomic level) No Personal risk factors (eg, previous pregnancy with low birth weight or small for gestational age infant, previous adverse pregnancy outcome [eg, stillbirth], interval >10 years between pregnancies) No   Indications for early 1 hour GTT (per uptodate)  BMI >25 (>23 in Asian women) AND one of the following  Gestational diabetes mellitus in a previous pregnancy No Glycated hemoglobin ?5.7 percent (39 mmol/mol), impaired glucose tolerance, or impaired fasting glucose on previous testing No First-degree relative with diabetes No High-risk race/ethnicity (eg, African American, Latino, Native American, [place]an, Pacific Islander) No History of cardiovascular disease No Hypertension or on therapy for hypertension No High-density lipoprotein cholesterol level <35 mg/dL (0.90 mmol/L) and/or a triglyceride level >250 mg/dL (2.82 mmol/L) No Polycystic ovary syndrome No Physical inactivity No Other clinical condition associated with insulin resistance (eg, severe obesity, acanthosis  nigricans) No Previous birth of an infant weighing ?4000 g No Previous stillbirth of unknown cause No Exam   Vitals:   07/14/20 1327  BP: 130/75  Pulse: (!) 114  Weight: 179 lb 12.8 oz (81.6 kg)   Fetal Heart Rate (bpm): 131  Uterus:     Pelvic Exam: Perineum: no hemorrhoids, normal perineum   Vulva: normal external genitalia, no lesions   Vagina:  normal mucosa, normal discharge   Cervix: no lesions and normal, pap smear  done.    Adnexa: normal adnexa and no mass, fullness, tenderness   Bony Pelvis: average  System: General: well-developed, well-nourished female in no acute distress   Breast:  normal appearance, no masses or tenderness   Skin: normal coloration and turgor, no rashes   Neurologic: oriented, normal, negative, normal mood   Extremities: normal strength, tone, and muscle mass, ROM of all joints is normal   HEENT PERRLA, extraocular movement intact and sclera clear, anicteric   Mouth/Teeth mucous membranes moist, pharynx normal without lesions and dental hygiene good   Neck supple and no masses   Cardiovascular: regular rate and rhythm   Respiratory:  no respiratory distress, normal breath sounds   Abdomen: soft, non-tender; bowel sounds normal; no masses,  no organomegaly     Assessment:   Pregnancy: G2P1001 Patient Active Problem List   Diagnosis Date Noted  . Encounter for supervision of normal pregnancy in first trimester 07/07/2020     Plan:   1. Encounter for supervision of other normal pregnancy in first trimester --Anticipatory guidance about next visits/weeks of pregnancy given. --Reviewed today's normal first trimester anatomy US. Ordered with MFM because enlarged/full bladder noted on [place] in office first trimester.   --Routine anatomy scan at 19 weeks ordered  --Next visit in 4 weeks in office for AFP  - CBC/D/Plt+RPR+Rh+ABO+Rub Ab... - Cytology - PAP - Genetic Screening - Culture, OB Urine - Cervicovaginal ancillary only  2. [redacted] weeks gestation of pregnancy   3. Flexural eczema --Trying Eucerin, usually works but is not working.  Itching/raw skin at elbows, on legs, and around eyes. --Pt encouraged to look at [employer], consider dietary changes which help some people with autoimmune skin conditions. - triamcinolone ointment (KENALOG) 0.5 %; Apply 1 application topically 2 (two) times daily.  Dispense: 30 g; Refill: 0  4. Nausea and vomiting during  pregnancy --Resent Diclegis to pharmacy, originally sent to [place] or pt went to [place] to pick it up. - Doxylamine-Pyridoxine (DICLEGIS) 10-10 MG TBEC; Take 2 tablets by mouth at bedtime. If symptoms persist, add one tablet in the morning and one in the afternoon  Dispense: 100 tablet; Refill: 5   Initial labs drawn. Continue prenatal vitamins. Discussed and offered genetic screening options, including Quad screen/AFP, NIPS testing, and option to decline testing. Benefits/risks/alternatives reviewed. Pt aware that anatomy US is form of genetic screening with lower accuracy in detecting trisomies than blood work.  Pt chooses genetic screening today. NIPS: ordered. Ultrasound discussed; fetal anatomic survey: ordered. Problem list reviewed and updated. The nature of [place] with multiple MDs and other Advanced Practice Providers was explained to patient; also emphasized that residents, students are part of our team. Routine obstetric precautions reviewed. Return in about 4 weeks (around 08/11/2020).   Fatima Blank, CNM 07/14/20 2:12 PM

## 2020-07-14 NOTE — Patient Instructions (Addendum)
Obstetrics: Normal and Problem Pregnancies (7th ed., pp. 102-121). [place], [place]: Elsevier."> Textbook of Family Medicine (9th ed., pp. [phone]). [place], [place]: Elsevier [employer].">  First Trimester of Pregnancy  The first trimester of pregnancy starts on the first day of your last menstrual period until the end of week 12. This is months 1 through 3 of pregnancy. A week after a sperm fertilizes an egg, the egg will implant into the wall of the uterus and begin to develop into a baby. By the end of 12 weeks, all the baby's organs will be formed and the baby will be 2-3 inches in size. Body changes during your first trimester Your body goes through many changes during pregnancy. The changes vary and generally return to normal after your baby is born. Physical changes  You may gain or lose weight.  Your breasts may begin to grow larger and become tender. The tissue that surrounds your nipples (areola) may become darker.  Dark spots or blotches (chloasma or mask of pregnancy) may develop on your face.  You may have changes in your hair. These can include thickening or thinning of your hair or changes in texture. Health changes  You may feel nauseous, and you may vomit.  You may have heartburn.  You may develop headaches.  You may develop constipation.  Your gums may bleed and may be sensitive to brushing and flossing. Other changes  You may tire easily.  You may urinate more often.  Your menstrual periods will stop.  You may have a loss of appetite.  You may develop cravings for certain kinds of food.  You may have changes in your emotions from day to day.  You may have more vivid and strange dreams. Follow these instructions at home: Medicines  Follow your health care provider's instructions regarding medicine use. Specific medicines may be either safe or unsafe to take during pregnancy. Do not take any medicines unless told to by your health care provider.  Take a  prenatal vitamin that contains at least 600 micrograms (mcg) of folic acid. Eating and drinking  Eat a healthy diet that includes fresh fruits and vegetables, whole grains, good sources of protein such as meat, eggs, or tofu, and low-fat dairy products.  Avoid raw meat and unpasteurized juice, milk, and cheese. These carry germs that can harm you and your baby.  If you feel nauseous or you vomit: ? Eat 4 or 5 small meals a day instead of 3 large meals. ? Try eating a few soda crackers. ? Drink liquids between meals instead of during meals.  You may need to take these actions to prevent or treat constipation: ? Drink enough fluid to keep your urine pale yellow. ? Eat foods that are high in fiber, such as beans, whole grains, and fresh fruits and vegetables. ? Limit foods that are high in fat and processed sugars, such as fried or sweet foods. Activity  Exercise only as directed by your health care provider. Most people can continue their usual exercise routine during pregnancy. Try to exercise for 30 minutes at least 5 days a week.  Stop exercising if you develop pain or cramping in the lower abdomen or lower back.  Avoid exercising if it is very hot or humid or if you are at high altitude.  Avoid heavy lifting.  If you choose to, you may have sex unless your health care provider tells you not to. Relieving pain and discomfort  Wear a good support bra to relieve breast  tenderness.  Rest with your legs elevated if you have leg cramps or low back pain.  If you develop bulging veins (varicose veins) in your legs: ? Wear support hose as told by your health care provider. ? Elevate your feet for 15 minutes, 3-4 times a day. ? Limit salt in your diet. Safety  Wear your seat belt at all times when driving or riding in a car.  Talk with your health care provider if someone is verbally or physically abusive to you.  Talk with your health care provider if you are feeling sad or have  thoughts of hurting yourself. Lifestyle  Do not use hot tubs, steam rooms, or saunas.  Do not douche. Do not use tampons or scented sanitary pads.  Do not use herbal remedies, alcohol, illegal drugs, or medicines that are not approved by your health care provider. Chemicals in these products can harm your baby.  Do not use any products that contain nicotine or tobacco, such as cigarettes, e-cigarettes, and chewing tobacco. If you need help quitting, ask your health care provider.  Avoid cat litter boxes and soil used by cats. These carry germs that can cause birth defects in the baby and possibly loss of the unborn baby (fetus) by miscarriage or stillbirth. General instructions  During routine prenatal visits in the first trimester, your health care provider will do a physical exam, perform necessary tests, and ask you how things are going. Keep all follow-up visits. This is important.  Ask for help if you have counseling or nutritional needs during pregnancy. Your health care provider can offer advice or refer you to specialists for help with various needs.  Schedule a dentist appointment. At home, brush your teeth with a soft toothbrush. Floss gently.  Write down your questions. Take them to your prenatal visits. Where to find more information  American Pregnancy Association: americanpregnancy.org  [employer] of Obstetricians and Gynecologists: https://www.todd-brady.net/  Office on [employer] [facility]: MightyReward.co.nz Contact a health care provider if you have:  Dizziness.  A fever.  Mild pelvic cramps, pelvic pressure, or nagging pain in the abdominal area.  Nausea, vomiting, or diarrhea that lasts for 24 hours or longer.  A bad-smelling vaginal discharge.  Pain when you urinate.  Known exposure to a contagious illness, such as chickenpox, measles, Zika virus, HIV, or hepatitis. Get help right away if you have:  Spotting or bleeding from your  vagina.  Severe abdominal cramping or pain.  Shortness of breath or chest pain.  Any kind of trauma, such as from a fall or a car crash.  New or increased pain, swelling, or redness in an arm or leg. Summary  The first trimester of pregnancy starts on the first day of your last menstrual period until the end of week 12 (months 1 through 3).  Eating 4 or 5 small meals a day rather than 3 large meals may help to relieve nausea and vomiting.  Do not use any products that contain nicotine or tobacco, such as cigarettes, e-cigarettes, and chewing tobacco. If you need help quitting, ask your health care provider.  Keep all follow-up visits. This is important. This information is not intended to replace advice given to you by your health care provider. Make sure you discuss any questions you have with your health care provider. Document Revised: 10/31/2019 Document Reviewed: 09/06/2019 Elsevier Patient Education  2021 Elsevier Inc.    Eczema Eczema refers to a group of skin conditions that cause skin to become rough and  inflamed. Each type of eczema has different triggers, symptoms, and treatments. Eczema of any type is usually itchy. Symptoms range from mild to severe. Eczema is not spread from person to person (is not contagious). It can appear on different parts of the body at different times. One person's eczema may look different from another person's eczema. What are the causes? The exact cause of this condition is not known. However, exposure to certain environmental factors, irritants, and allergens can make the condition worse. What are the signs or symptoms? Symptoms of this condition depend on the type of eczema you have. The types include:  Contact dermatitis. There are two kinds: ? Irritant contact dermatitis. This happens when something irritates the skin and causes a rash. ? Allergic contact dermatitis. This happens when your skin comes in contact with something you are  allergic to (allergens). This can include poison ivy, chemicals, or medicines that were applied to your skin.  Atopic dermatitis. This is a long-term (chronic) skin disease that keeps coming back (recurring). It is the most common type of eczema. Usual symptoms are a red rash and itchy, dry, scaly skin. It usually starts showing signs in infancy and can last through adulthood.  Dyshidrotic eczema. This is a form of eczema on the hands and feet. It shows up as very itchy, fluid-filled blisters. It can affect people of any age but is more common before age 58.  Hand eczema. This causes very itchy areas of skin on the palms and sides of the hands and fingers. This type of eczema is common in industrial jobs where you may be exposed to different types of irritants.  Lichen simplex chronicus. This type of eczema occurs when a person constantly scratches one area of the body. Repeated scratching of the area leads to thickened skin (lichenification). This condition can accompany other types of eczema. It is more common in adults but may also be seen in children.  Nummular eczema. This is a common type of eczema that most often affects the lower legs and the backs of the hands. It typically causes an itchy, red, circular, crusty lesion (plaque). Scratching may become a habit and can cause bleeding. Nummular eczema occurs most often in middle-aged or older people.  Seborrheic dermatitis. This is a common skin disease that mainly affects the scalp. It may also affect other oily areas of the body, such as the face, sides of the nose, eyebrows, ears, eyelids, and chest. It is marked by small scaling and redness of the skin (erythema). This can affect people of all ages. In infants, this condition is called cradle cap.  Stasis dermatitis. This is a common skin disease that can cause itching, scaling, and hyperpigmentation, usually on the legs and feet. It occurs most often in people who have a condition that  prevents blood from being pumped through the veins in the legs (chronic venous insufficiency). Stasis dermatitis is a chronic condition that needs long-term management.   How is this diagnosed? This condition may be diagnosed based on:  A physical exam of your skin.  Your medical history.  Skin patch tests. These tests involve using patches that contain possible allergens and placing them on your back. Your health care provider will check in a few days to see if an allergic reaction occurred. How is this treated? Treatment for eczema is based on the type of eczema you have. You may be given hydrocortisone steroid medicine or antihistamines. These can relieve itching quickly and help reduce inflammation.  These may be prescribed or purchased over the counter, depending on the strength that is needed. Follow these instructions at home:  Take or apply over-the-counter and prescription medicines only as told by your health care provider.  Use creams or ointments to moisturize your skin. Do not use lotions.  Learn what triggers or irritates your symptoms so you can avoid these things.  Treat symptom flare-ups quickly.  Do not scratch your skin. This can make your rash worse.  Keep all follow-up visits. This is important. Where to find more information  American Academy of Dermatology: MarketingSheets.si  National Eczema Association: nationaleczema.org  The Society for Pediatric Dermatology: pedsderm.net Contact a health care provider if:  You have severe itching, even with treatment.  You scratch your skin regularly until it bleeds.  Your rash looks different than usual.  Your skin is painful, swollen, or more red than usual.  You have a fever. Summary  Eczema refers to a group of skin conditions that cause skin to become rough and inflamed. Each type has different triggers.  Eczema of any type causes itching that may range from mild to severe.  Treatment varies based on the type of  eczema you have. Hydrocortisone steroid medicine or antihistamines can help with itching and inflammation.  Protecting your skin is the best way to prevent eczema. Use creams or ointments to moisturize your skin. Avoid triggers and irritants. Treat flare-ups quickly. This information is not intended to replace advice given to you by your health care provider. Make sure you discuss any questions you have with your health care provider. Document Revised: 03/03/2020 Document Reviewed: 03/03/2020 Elsevier Patient Education  2021 [employer].

## 2020-07-15 LAB — CBC/D/PLT+RPR+RH+ABO+RUB AB...
Antibody Screen: NEGATIVE
Basophils Absolute: 0 10*3/uL (ref 0.0–0.2)
Basos: 0 %
EOS (ABSOLUTE): 0.2 10*3/uL (ref 0.0–0.4)
Eos: 2 %
HCV Ab: <0.1 s/co ratio (ref 0.0–0.9)
HIV Screen 4th Generation wRfx: NONREACTIVE
Hematocrit: 37.3 % (ref 34.0–46.6)
Hemoglobin: 13.1 g/dL (ref 11.1–15.9)
Hepatitis B Surface Ag: NEGATIVE
Immature Grans (Abs): 0 10*3/uL (ref 0.0–0.1)
Immature Granulocytes: 0 %
Lymphocytes Absolute: 1.6 10*3/uL (ref 0.7–3.1)
Lymphs: 14 %
MCH: 30 pg (ref 26.6–33.0)
MCHC: 35.1 g/dL (ref 31.5–35.7)
MCV: 85 fL (ref 79–97)
Monocytes Absolute: 1 10*3/uL — ABNORMAL HIGH (ref 0.1–0.9)
Monocytes: 10 %
Neutrophils Absolute: 8.1 10*3/uL — ABNORMAL HIGH (ref 1.4–7.0)
Neutrophils: 74 %
Platelets: 414 10*3/uL (ref 150–450)
RBC: 4.37 x10E6/uL (ref 3.77–5.28)
RDW: 12.7 % (ref 11.7–15.4)
RPR Ser Ql: NONREACTIVE
Rh Factor: POSITIVE
Rubella Antibodies, IGG: 3.04 index (ref >0.99)
WBC: 10.9 10*3/uL — ABNORMAL HIGH (ref 3.4–10.8)

## 2020-07-15 LAB — CERVICOVAGINAL ANCILLARY ONLY
Chlamydia: NEGATIVE
Comment: NEGATIVE
Comment: NEGATIVE
Comment: NORMAL
Neisseria Gonorrhea: NEGATIVE
Trichomonas: NEGATIVE

## 2020-07-15 LAB — HCV INTERPRETATION

## 2020-07-16 LAB — URINE CULTURE, OB REFLEX: Organism ID, Bacteria: NO GROWTH

## 2020-07-16 LAB — CYTOLOGY - PAP: Diagnosis: NEGATIVE

## 2020-07-16 LAB — CULTURE, OB URINE

## 2020-07-21 ENCOUNTER — Encounter: Payer: Self-pay | Admitting: Advanced Practice Midwife

## 2020-07-28 ENCOUNTER — Encounter: Payer: Self-pay | Admitting: Advanced Practice Midwife

## 2020-08-11 ENCOUNTER — Ambulatory Visit (INDEPENDENT_AMBULATORY_CARE_PROVIDER_SITE_OTHER): Payer: Medicaid Other | Admitting: Advanced Practice Midwife

## 2020-08-11 ENCOUNTER — Other Ambulatory Visit: Payer: Self-pay

## 2020-08-11 VITALS — BP 128/76 | HR 91 | Wt 177.0 lb

## 2020-08-11 DIAGNOSIS — L2082 Flexural eczema: Secondary | ICD-10-CM

## 2020-08-11 DIAGNOSIS — Z3480 Encounter for supervision of other normal pregnancy, unspecified trimester: Secondary | ICD-10-CM

## 2020-08-11 DIAGNOSIS — Z3A16 16 weeks gestation of pregnancy: Secondary | ICD-10-CM

## 2020-08-11 MED ORDER — TRIAMCINOLONE ACETONIDE 0.5 % EX OINT: 1.0000 "application " | TOPICAL_OINTMENT | Freq: Two times a day (BID) | CUTANEOUS | 1 refills | Status: DC

## 2020-08-11 NOTE — Patient Instructions (Signed)

## 2020-08-11 NOTE — Progress Notes (Signed)
   PRENATAL VISIT NOTE  Subjective:  Kim Powell is a 30 y.o. G2P1001 at [redacted]w[redacted]d being seen today for ongoing prenatal care.  She is currently monitored for the following issues for this low-risk pregnancy and has Encounter for supervision of normal pregnancy in first trimester on their problem list.  Patient reports no complaints.  Contractions: Not present. Vag. Bleeding: None.  Movement: Present. Denies leaking of fluid.   The following portions of the patient's history were reviewed and updated as appropriate: allergies, current medications, past family history, past medical history, past social history, past surgical history and problem list.   Objective:   Vitals:   08/11/20 1333  BP: 128/76  Pulse: 91  Weight: 177 lb (80.3 kg)    Fetal Status: Fetal Heart Rate (bpm): 132   Movement: Present     General:  Alert, oriented and cooperative. Patient is in no acute distress.  Skin: Skin is warm and dry. No rash noted.   Cardiovascular: Normal heart rate noted  Respiratory: Normal respiratory effort, no problems with respiration noted  Abdomen: Soft, gravid, appropriate for gestational age.  Pain/Pressure: Absent     Pelvic: Cervical exam deferred        Extremities: Normal range of motion.  Edema: None  Mental Status: Normal mood and affect. Normal behavior. Normal judgment and thought content.   Assessment and Plan:  Pregnancy: G2P1001 at [redacted]w[redacted]d 1. [redacted] weeks gestation of pregnancy  - AFP, Serum, Open Spina Bifida  2. Supervision of other normal pregnancy, antepartum --Anticipatory guidance about next visits/weeks of pregnancy given. --Next visit in 4 weeks --Anatomy US scheduled 08/25/20   3. Flexural eczema --Using Eucerin cream which helps but not enough. --Rx sent last visit but not to pharmacy pt uses so didn't pick up. Resent to pt preferred pharmacy today. - triamcinolone ointment (KENALOG) 0.5 %; Apply 1 application topically 2 (two) times daily.  Dispense: 30 g;  Refill: 1  Preterm labor symptoms and general obstetric precautions including but not limited to vaginal bleeding, contractions, leaking of fluid and fetal movement were reviewed in detail with the patient. Please refer to After Visit Summary for other counseling recommendations.   No follow-ups on file.  Future Appointments  Date Time Provider Department Center  08/25/2020 10:15 AM [facility] NURSE [facility]  08/25/2020 10:30 AM [facility] US3 [facility]    Sharen Counter, CNM

## 2020-08-13 LAB — AFP, SERUM, OPEN SPINA BIFIDA
AFP MoM: 0.83
AFP Value: 28.5 ng/mL
Gest. Age on Collection Date: 16.9 weeks
Maternal Age At EDD: 28 yr
OSBR Risk 1 IN: 10000
Test Results:: NEGATIVE
Weight: 177 [lb_av]

## 2020-08-25 ENCOUNTER — Ambulatory Visit: Payer: Medicaid Other

## 2020-08-25 ENCOUNTER — Other Ambulatory Visit: Payer: Medicaid Other

## 2020-08-26 ENCOUNTER — Other Ambulatory Visit: Payer: Self-pay | Admitting: *Deleted

## 2020-08-26 ENCOUNTER — Ambulatory Visit: Payer: Medicaid Other | Admitting: *Deleted

## 2020-08-26 ENCOUNTER — Encounter: Payer: Self-pay | Admitting: *Deleted

## 2020-08-26 ENCOUNTER — Ambulatory Visit: Payer: Medicaid Other | Attending: Obstetrics and Gynecology

## 2020-08-26 ENCOUNTER — Other Ambulatory Visit: Payer: Self-pay

## 2020-08-26 VITALS — BP 111/61 | HR 85

## 2020-08-26 DIAGNOSIS — Z3A19 19 weeks gestation of pregnancy: Secondary | ICD-10-CM | POA: Diagnosis not present

## 2020-08-26 DIAGNOSIS — O99212 Obesity complicating pregnancy, second trimester: Secondary | ICD-10-CM | POA: Diagnosis present

## 2020-08-26 DIAGNOSIS — O358XX0 Maternal care for other (suspected) fetal abnormality and damage, not applicable or unspecified: Secondary | ICD-10-CM | POA: Diagnosis not present

## 2020-08-26 DIAGNOSIS — E669 Obesity, unspecified: Secondary | ICD-10-CM

## 2020-08-26 DIAGNOSIS — G930 Cerebral cysts: Secondary | ICD-10-CM | POA: Insufficient documentation

## 2020-08-26 DIAGNOSIS — Z362 Encounter for other antenatal screening follow-up: Secondary | ICD-10-CM | POA: Insufficient documentation

## 2020-08-26 DIAGNOSIS — O99352 Diseases of the nervous system complicating pregnancy, second trimester: Secondary | ICD-10-CM | POA: Diagnosis not present

## 2020-08-26 DIAGNOSIS — O350XX0 Maternal care for (suspected) central nervous system malformation in fetus, not applicable or unspecified: Secondary | ICD-10-CM

## 2020-08-26 DIAGNOSIS — O3503X0 Maternal care for (suspected) central nervous system malformation or damage in fetus, choroid plexus cysts, not applicable or unspecified: Secondary | ICD-10-CM

## 2020-08-26 DIAGNOSIS — Z6831 Body mass index (BMI) 31.0-31.9, adult: Secondary | ICD-10-CM

## 2020-09-08 ENCOUNTER — Telehealth (INDEPENDENT_AMBULATORY_CARE_PROVIDER_SITE_OTHER): Payer: Medicaid Other | Admitting: Advanced Practice Midwife

## 2020-09-08 VITALS — BP 129/69 | HR 84 | Wt 177.0 lb

## 2020-09-08 DIAGNOSIS — O358XX0 Maternal care for other (suspected) fetal abnormality and damage, not applicable or unspecified: Secondary | ICD-10-CM | POA: Insufficient documentation

## 2020-09-08 DIAGNOSIS — O3503X0 Maternal care for (suspected) central nervous system malformation or damage in fetus, choroid plexus cysts, not applicable or unspecified: Secondary | ICD-10-CM | POA: Insufficient documentation

## 2020-09-08 DIAGNOSIS — Z3480 Encounter for supervision of other normal pregnancy, unspecified trimester: Secondary | ICD-10-CM

## 2020-09-08 DIAGNOSIS — O35EXX0 Maternal care for other (suspected) fetal abnormality and damage, fetal genitourinary anomalies, not applicable or unspecified: Secondary | ICD-10-CM | POA: Insufficient documentation

## 2020-09-08 DIAGNOSIS — O350XX0 Maternal care for (suspected) central nervous system malformation in fetus, not applicable or unspecified: Secondary | ICD-10-CM

## 2020-09-08 DIAGNOSIS — Z3482 Encounter for supervision of other normal pregnancy, second trimester: Secondary | ICD-10-CM

## 2020-09-08 DIAGNOSIS — Z3A20 20 weeks gestation of pregnancy: Secondary | ICD-10-CM

## 2020-09-08 NOTE — Progress Notes (Signed)
OBSTETRICS PRENATAL VIRTUAL VISIT ENCOUNTER NOTE  Provider location: [facility]   Patient location: Home  I connected with Kim Powell on 09/08/20 at  1:20 PM EDT by MyChart Video Encounter and verified that I am speaking with the correct person using two identifiers.   I discussed the limitations, risks, security and privacy concerns of performing an evaluation and management service virtually and the availability of in person appointments. I also discussed with the patient that there may be a patient responsible charge related to this service. The patient expressed understanding and agreed to proceed. Subjective:  Kim Powell is a 30 y.o. G2P1001 at [redacted]w[redacted]d being seen today for ongoing prenatal care.  She is currently monitored for the following issues for this low-risk pregnancy and has Encounter for supervision of normal pregnancy in first trimester on their problem list.  Patient reports no complaints.  Contractions: Not present. Vag. Bleeding: None.  Movement: Present. Denies any leaking of fluid.   The following portions of the patient's history were reviewed and updated as appropriate: allergies, current medications, past family history, past medical history, past social history, past surgical history and problem list.   Objective:   Vitals:   09/08/20 1301  BP: 129/69  Pulse: 84  Weight: 177 lb (80.3 kg)    Fetal Status:     Movement: Present     General:  Alert, oriented and cooperative. Patient is in no acute distress.  Respiratory: Normal respiratory effort, no problems with respiration noted  Mental Status: Normal mood and affect. Normal behavior. Normal judgment and thought content.  Rest of physical exam deferred due to type of encounter  Imaging: [place] MFM OB DETAIL +14 WK  Result Date: 08/26/2020 ----------------------------------------------------------------------  OBSTETRICS REPORT                       (Signed Final 08/26/2020  10:05 am) ---------------------------------------------------------------------- Patient Info  ID #:       [id number]                          D.O.B.:  10/09/1990 (29 yrs)  Name:       Kim Powell                    Visit Date: 08/26/2020 08:06 am ---------------------------------------------------------------------- Performed By  Attending:        Noralee Space MD        Ref. Address:     [facility]                                                             [facility]                                                             Healthcare  Performed By:     Truitt Leep,      Location:         [facility]  Referred By:      Warden FillersLAWRENCE A                    BASS MD ---------------------------------------------------------------------- Orders  #  Description                           Code        Ordered By  1  US MFM OB DETAIL +14 WK               76811.01    [facility] ----------------------------------------------------------------------  #  Order #                     Accession #                Episode #  1  [id number]                   [id number]                 [id number] ---------------------------------------------------------------------- Indications  Abnormal fetal ultrasound (enlarged fetal      O28.9  bladder)  Obesity complicating pregnancy, first          O99.211  trimester (BMI 31)  Encounter for antenatal screening for          Z36.3  malformations  [redacted] weeks gestation of pregnancy                Z3A.19  Fetal choroid plexus cyst                      O35.8XX0  Pyelectasis of fetus on prenatal ultrasound    O28.3 ---------------------------------------------------------------------- Fetal Evaluation  Num Of Fetuses:         1  Cardiac Activity:       Observed  Presentation:            Variable  Placenta:               Posterior  P. Cord Insertion:      Visualized, central  Amniotic Fluid  AFI FV:      Within normal limits ---------------------------------------------------------------------- Biometry  BPD:      41.9  mm     G. Age:  18w 5d         37  %    CI:         71.1   %    70 - 86  FL/HC:      18.4   %    16.1 - 18.3  HC:      158.3  mm     G. Age:  18w 5d         28  %    HC/AC:      1.15        1.09 - 1.39  AC:      137.8  mm     G. Age:  19w 1d         53  %    FL/BPD:     69.5   %  FL:       29.1  mm     G. Age:  19w 0d         41  %    FL/AC:      21.1   %    20 - 24  HUM:      28.7  mm     G. Age:  19w 2d         58  %  CER:      19.4  mm     G. Age:  18w 6d         30  %  NFT:       4.6  mm  LV:        7.2  mm  CM:        6.4  mm  Est. FW:     270  gm    0 lb 10 oz      47  % ---------------------------------------------------------------------- OB History  Gravidity:    2         Term:   1  Living:       1 ---------------------------------------------------------------------- Gestational Age  U/S Today:     18w 6d                                        EDD:   01/21/21  Best:          19w 0d     Det. By:  U/S C R L  (07/07/20)    EDD:   01/20/21 ---------------------------------------------------------------------- Anatomy  Cranium:               Appears normal         LVOT:                   Appears normal  Cavum:                 Appears normal         Aortic Arch:            Appears normal  Ventricles:            Appears normal         Ductal Arch:            Appears normal  Choroid Plexus:        Left choroid           Diaphragm:              Appears normal                         plexus cyst  Cerebellum:  Appears normal         Stomach:                Appears normal, left                                                                        sided  Posterior Fossa:       Appears normal         Abdomen:                 Appears normal  Nuchal Fold:           Appears normal         Abdominal Wall:         Appears nml (cord                                                                        insert, abd wall)  Face:                  Appears normal         Cord Vessels:           Appears normal (3                         (orbits and profile)                           vessel cord)  Lips:                  Appears normal         Kidneys:                Bilateral UTD  Palate:                Appears normal         Bladder:                Appears normal  Thoracic:              Appears normal         Spine:                  Appears normal  Heart:                 Appears normal         Upper Extremities:      Appears normal                         (4CH, axis, and                         situs)  RVOT:                  Appears normal  Lower Extremities:      Appears normal  Other:  Fetus appears to be a female. Nasal bone visualized. Heels/feet and          open hands/5th digits visualized. VC, 3VV and 3VTV visualized. Right          remal pelvis measures 4.66mm. Left renal pelvis measures 4.37mm ---------------------------------------------------------------------- Cervix Uterus Adnexa  Cervix  Length:           3.46  cm.  Normal appearance by transabdominal scan.  Right Ovary  Within normal limits.  Left Ovary  Within normal limits. ---------------------------------------------------------------------- Impression  G2 P1. Patient is here for fetal anatomy scan. On first-  trimester ultrasound, fetal anatomy appeared normal (no  enlarged bladder).  On cell-free fetal DNA screening, the risks of fetal  aneuploidies are not increased. MSAFP screening showed  low risk for open-neural tube defects.  We performed a fetal anatomy scan.  Unilateral choroid  plexus cyst (CPC) is seen.  The bilateral urinary tract  dilations, measuring 4 to 5 mm, are seen.  Both kidneys  otherwise appear normal.  Fetal bladder appears normal.  No other markers  of aneuploidies or fetal structural defects  are seen. Fetal biometry is consistent with her previously-  established dates. Amniotic fluid is normal and good fetal  activity is seen. Patient understands the limitations of  ultrasound in detecting fetal anomalies.  I explained that UTD usually resolves with advancing  gestation or after delivery, and only rarely associated with  obstructive uropathy.  Choroid plexus cysts are seen in about 3% to 4% of normal  fetuses.  Given that she had low risk for fetal aneuploidies on cell free  fetal DNA screening, both UTD and CPC are not considered  markers of aneuploidies. ---------------------------------------------------------------------- Recommendations  -An appointment was made for her to return in 8 weeks for  fetal growth and renal assessments. ----------------------------------------------------------------------                  Noralee Space, MD Electronically Signed Final Report   08/26/2020 10:05 am ----------------------------------------------------------------------   Assessment and Plan:  Pregnancy: G2P1001 at [redacted]w[redacted]d 1. Supervision of other normal pregnancy, antepartum --Anticipatory guidance about next visits/weeks of pregnancy given. --Reviewed anatomy US results, normal with CPC and UTD, normal NIPS testing, follow up scheduled for UTD --Next visit in office in 4 weeks, GTT in 8 weeks  2. [redacted] weeks gestation of pregnancy  Preterm labor symptoms and general obstetric precautions including but not limited to vaginal bleeding, contractions, leaking of fluid and fetal movement were reviewed in detail with the patient. I discussed the assessment and treatment plan with the patient. The patient was provided an opportunity to ask questions and all were answered. The patient agreed with the plan and demonstrated an understanding of the instructions. The patient was advised to call back or seek an in-person office evaluation/go to [facility] at [facility] for any urgent or concerning symptoms. Please refer to After Visit Summary for other counseling recommendations.   I provided 7 minutes of face-to-face time during this encounter.  No follow-ups on file.  Future Appointments  Date Time Provider Department Center  09/08/2020  1:20 PM Leftwich-Kirby, Wilmer Floor, CNM [facility] None  10/21/2020  2:45 PM [facility] NURSE [facility]  10/21/2020  3:00 PM [facility] US1 [facility]    Sharen Counter, CNM [facility] [employer], [facility]

## 2020-09-08 NOTE — Progress Notes (Signed)
+   Fetal movements. No complaints.

## 2020-10-06 ENCOUNTER — Other Ambulatory Visit: Payer: Self-pay

## 2020-10-06 ENCOUNTER — Encounter: Payer: Self-pay | Admitting: Advanced Practice Midwife

## 2020-10-06 ENCOUNTER — Ambulatory Visit (INDEPENDENT_AMBULATORY_CARE_PROVIDER_SITE_OTHER): Payer: Medicaid Other | Admitting: Advanced Practice Midwife

## 2020-10-06 VITALS — BP 122/74 | HR 94 | Wt 184.0 lb

## 2020-10-06 DIAGNOSIS — Z3480 Encounter for supervision of other normal pregnancy, unspecified trimester: Secondary | ICD-10-CM

## 2020-10-06 DIAGNOSIS — O358XX0 Maternal care for other (suspected) fetal abnormality and damage, not applicable or unspecified: Secondary | ICD-10-CM

## 2020-10-06 DIAGNOSIS — O35EXX0 Maternal care for other (suspected) fetal abnormality and damage, fetal genitourinary anomalies, not applicable or unspecified: Secondary | ICD-10-CM

## 2020-10-06 DIAGNOSIS — Z3A24 24 weeks gestation of pregnancy: Secondary | ICD-10-CM

## 2020-10-06 DIAGNOSIS — K0889 Other specified disorders of teeth and supporting structures: Secondary | ICD-10-CM

## 2020-10-06 MED ORDER — LIDOCAINE VISCOUS HCL 2 % MT SOLN: 15.0000 mL | OROMUCOSAL | 1 refills | Status: DC | PRN

## 2020-10-06 NOTE — Progress Notes (Signed)
Pt is having issues with tooth pain, currently taking Tylenol.  Pt is scheduled with dentist.

## 2020-10-06 NOTE — Patient Instructions (Signed)
Dental Pain Dental pain is often a sign that something is wrong with your teeth or gums. It is also something that can occur following dental treatment. If you have dental pain, it is important to contact your dental care provider, especially if the cause of the pain has not been determined. Dental pain may be of varying intensity and can be caused by many things, including:  Tooth decay (cavities or caries). Cavities are caused by bacteria that produce acids that irritate the nerve of your tooth, making it sensitive to air and hot or cold temperatures. This eventually causes discomfort or pain.  Abscess or infection. Once the bacteria reach the inner part of the tooth (pulp), a bacterial infection (dental abscess) can occur. Pus typically collects at the end of the root of a tooth.  Injury.  A crack in the tooth.  Gum recession exposing the root, and possibly the nerves, of a tooth.  Gum (periodontal)disease.  Abnormal grinding or clenching.  Poor or improper home care.  An unknown reason (idiopathic). Your pain may be mild or severe. It may occur when you are:  Chewing.  Exposed to hot or cold temperatures.  Eating or drinking sugary foods or beverages, such as soda or candy. Your pain may be constant, or it may come and go without cause. Follow these instructions at home: The following actions may help to lessen any discomfort that you are feeling before or after getting dental care. Medicines  Take over-the-counter and prescription medicines only as told by your dental care provider.  If you were prescribed an antibiotic medicine, take it as told by your dental care provider. Do not stop taking the antibiotic even if you start to feel better. Eating and drinking Avoid foods or drinks that cause you pain, such as:  Very hot or very cold foods or drinks.  Sweet or sugary foods or drinks. Managing pain and swelling  Ice can sometimes be used to reduce pain and swelling,  especially if the pain is following dental treatment.  If directed, put ice on the painful area of your face. To do this: ? Put ice in a plastic bag. ? Place a towel between your skin and the bag. ? Leave the ice on for 20 minutes, 2-3 times a day. ? Remove the ice if your skin turns bright red. This is very important. If you cannot feel pain, heat, or cold, you have a greater risk of damage to the area.   Brushing your teeth  To keep your mouth and gums healthy, brush your teeth twice a day using a fluoride toothpaste.  Use a toothpaste made for sensitive teeth as directed by your dental care provider, especially if the root is exposed.  Always brush your teeth with a soft-bristled toothbrush. This will help prevent irritation to your gums. General instructions  Floss at least once a day.  Do not apply heat to the outside of the face.  Gargle with a mixture of salt and water 3-4 times a day or as needed. To make salt water, completely dissolve -1 tsp (3-6 g) of salt in 1 cup (237 mL) of warm water.  Keep all follow-up visits. This is important. Contact a dental care provider if:  You have any unexplained dental pain.  Your pain is not controlled with medicines.  Your symptoms get worse.  You have new symptoms. Get help right away if:  You are unable to open your mouth.  You are having trouble breathing or   swallowing.  You have a fever.  You notice that your face, neck, or jaw is swollen. These symptoms may represent a serious problem that is an emergency. Do not wait to see if the symptoms will go away. Get medical help right away. Call your local emergency services (911 in the [place] Do not drive yourself to the hospital. Summary  Dental pain may be caused by many things, including tooth decay and infection.  Your pain may be mild or severe.  Take over-the-counter and prescription medicines only as told by your dental care provider.  Watch your dental pain for any  changes. Let your dental care provider know if your symptoms get worse. This information is not intended to replace advice given to you by your health care provider. Make sure you discuss any questions you have with your health care provider. Document Revised: 02/27/2020 Document Reviewed: 02/27/2020 Elsevier Patient Education  2021 [employer].

## 2020-10-06 NOTE — Progress Notes (Signed)
   PRENATAL VISIT NOTE  Subjective:  Kim Powell is a 30 y.o. G2P1001 at [redacted]w[redacted]d being seen today for ongoing prenatal care.  She is currently monitored for the following issues for this low-risk pregnancy and has Encounter for supervision of normal pregnancy in first trimester; Choroid plexus cyst of fetus in singleton pregnancy; and Pyelectasis of fetus on prenatal ultrasound on their problem list.  Patient reports pain in her mouth with broken tooth on upper right side.  Contractions: Not present. Vag. Bleeding: None.  Movement: Present. Denies leaking of fluid.   The following portions of the patient's history were reviewed and updated as appropriate: allergies, current medications, past family history, past medical history, past social history, past surgical history and problem list.   Objective:   Vitals:   10/06/20 1321  BP: 122/74  Pulse: 94  Weight: 184 lb (83.5 kg)    Fetal Status: Fetal Heart Rate (bpm): 150   Movement: Present     General:  Alert, oriented and cooperative. Patient is in no acute distress.  Skin: Skin is warm and dry. No rash noted.   Cardiovascular: Normal heart rate noted  Respiratory: Normal respiratory effort, no problems with respiration noted  Abdomen: Soft, gravid, appropriate for gestational age.  Pain/Pressure: Absent     Pelvic: Cervical exam deferred        Extremities: Normal range of motion.     Mental Status: Normal mood and affect. Normal behavior. Normal judgment and thought content.   Assessment and Plan:  Pregnancy: G2P1001 at [redacted]w[redacted]d 1. Supervision of other normal pregnancy, antepartum --Anticipatory guidance about next visits/weeks of pregnancy given. --Next visit in 4 weeks in office for GTT  2. Pyelectasis of fetus on prenatal ultrasound --Will likely resolve, f/u US on 5/17  3. [redacted] weeks gestation of pregnancy  4. Pain, dental --Pt reports broken tooth that is now causing pain in right upper and lower part of her mouth. Pain is  not well controlled with Tylenol.  Pt has dentist appt on 10/10/20.   --On exam, mild erythema of upper right gums, no edema or other abnormalities, no evidence of infection --Add topical lidocaine, continue Tylenol, follow up with dentist, letter provided for safe dental care in pregnancy - lidocaine (XYLOCAINE) 2 % solution; Use as directed 15 mLs in the mouth or throat as needed for mouth pain.  Dispense: 100 mL; Refill: 1  Preterm labor symptoms and general obstetric precautions including but not limited to vaginal bleeding, contractions, leaking of fluid and fetal movement were reviewed in detail with the patient. Please refer to After Visit Summary for other counseling recommendations.   No follow-ups on file.  Future Appointments  Date Time Provider Department Center  10/21/2020  2:45 PM [facility] NURSE [facility]  10/21/2020  3:00 PM [facility] US1 [facility]  11/04/2020  1:45 PM Adam Phenix, MD [facility] None    Sharen Counter, CNM

## 2020-10-21 ENCOUNTER — Encounter: Payer: Self-pay | Admitting: *Deleted

## 2020-10-21 ENCOUNTER — Ambulatory Visit: Payer: Medicaid Other | Attending: Obstetrics and Gynecology

## 2020-10-21 ENCOUNTER — Ambulatory Visit: Payer: Medicaid Other | Admitting: *Deleted

## 2020-10-21 ENCOUNTER — Other Ambulatory Visit: Payer: Self-pay

## 2020-10-21 DIAGNOSIS — O3503X0 Maternal care for (suspected) central nervous system malformation or damage in fetus, choroid plexus cysts, not applicable or unspecified: Secondary | ICD-10-CM

## 2020-10-21 DIAGNOSIS — O350XX0 Maternal care for (suspected) central nervous system malformation in fetus, not applicable or unspecified: Secondary | ICD-10-CM | POA: Diagnosis not present

## 2020-10-21 DIAGNOSIS — Z363 Encounter for antenatal screening for malformations: Secondary | ICD-10-CM

## 2020-10-21 DIAGNOSIS — O358XX0 Maternal care for other (suspected) fetal abnormality and damage, not applicable or unspecified: Secondary | ICD-10-CM | POA: Diagnosis present

## 2020-10-21 DIAGNOSIS — O99212 Obesity complicating pregnancy, second trimester: Secondary | ICD-10-CM

## 2020-10-21 DIAGNOSIS — E669 Obesity, unspecified: Secondary | ICD-10-CM

## 2020-10-21 DIAGNOSIS — O35EXX0 Maternal care for other (suspected) fetal abnormality and damage, fetal genitourinary anomalies, not applicable or unspecified: Secondary | ICD-10-CM

## 2020-10-21 DIAGNOSIS — O283 Abnormal ultrasonic finding on antenatal screening of mother: Secondary | ICD-10-CM

## 2020-10-21 DIAGNOSIS — Z3A27 27 weeks gestation of pregnancy: Secondary | ICD-10-CM

## 2020-10-22 ENCOUNTER — Other Ambulatory Visit: Payer: Self-pay | Admitting: *Deleted

## 2020-10-22 DIAGNOSIS — O35EXX0 Maternal care for other (suspected) fetal abnormality and damage, fetal genitourinary anomalies, not applicable or unspecified: Secondary | ICD-10-CM

## 2020-10-22 DIAGNOSIS — O358XX0 Maternal care for other (suspected) fetal abnormality and damage, not applicable or unspecified: Secondary | ICD-10-CM

## 2020-11-04 ENCOUNTER — Encounter: Payer: Medicaid Other | Admitting: Obstetrics & Gynecology

## 2020-11-17 ENCOUNTER — Other Ambulatory Visit: Payer: Self-pay

## 2020-11-17 ENCOUNTER — Ambulatory Visit (INDEPENDENT_AMBULATORY_CARE_PROVIDER_SITE_OTHER): Payer: Medicaid Other | Admitting: Obstetrics

## 2020-11-17 VITALS — BP 122/74 | HR 88 | Wt 189.0 lb

## 2020-11-17 DIAGNOSIS — Z3480 Encounter for supervision of other normal pregnancy, unspecified trimester: Secondary | ICD-10-CM

## 2020-11-17 DIAGNOSIS — O350XX0 Maternal care for (suspected) central nervous system malformation in fetus, not applicable or unspecified: Secondary | ICD-10-CM

## 2020-11-17 DIAGNOSIS — O358XX0 Maternal care for other (suspected) fetal abnormality and damage, not applicable or unspecified: Secondary | ICD-10-CM

## 2020-11-17 DIAGNOSIS — O3503X0 Maternal care for (suspected) central nervous system malformation or damage in fetus, choroid plexus cysts, not applicable or unspecified: Secondary | ICD-10-CM

## 2020-11-17 DIAGNOSIS — O35EXX0 Maternal care for other (suspected) fetal abnormality and damage, fetal genitourinary anomalies, not applicable or unspecified: Secondary | ICD-10-CM

## 2020-11-17 NOTE — Progress Notes (Signed)
ROB 30wks Declined TDAP Needs GTT scheduled for lab only visit. Missed 28wk appt. No complaints. Depression screening negative.

## 2020-11-17 NOTE — Progress Notes (Signed)
Subjective:  Kim Powell is a 30 y.o. G2P1001 at [redacted]w[redacted]d being seen today for ongoing prenatal care.  She is currently monitored for the following issues for this low-risk pregnancy and has Encounter for supervision of normal pregnancy in first trimester; Choroid plexus cyst of fetus in singleton pregnancy; and Pyelectasis of fetus on prenatal ultrasound on their problem list.  Patient reports no complaints.  Contractions: Not present. Vag. Bleeding: None.  Movement: Present. Denies leaking of fluid.   The following portions of the patient's history were reviewed and updated as appropriate: allergies, current medications, past family history, past medical history, past social history, past surgical history and problem list. Problem list updated.  Objective:   Vitals:   11/17/20 1427  BP: 122/74  Pulse: 88  Weight: 189 lb (85.7 kg)    Fetal Status:     Movement: Present     General:  Alert, oriented and cooperative. Patient is in no acute distress.  Skin: Skin is warm and dry. No rash noted.   Cardiovascular: Normal heart rate noted  Respiratory: Normal respiratory effort, no problems with respiration noted  Abdomen: Soft, gravid, appropriate for gestational age. Pain/Pressure: Present     Pelvic:  Cervical exam deferred        Extremities: Normal range of motion.  Edema: None  Mental Status: Normal mood and affect. Normal behavior. Normal judgment and thought content.   Urinalysis:      Assessment and Plan:  Pregnancy: G2P1001 at [redacted]w[redacted]d  1. Supervision of other normal pregnancy, antepartum Rx: - CBC - HIV antibody (with reflex) - RPR - 2 hour GTT to be scheduled ASAP as Lab Only visit  2. Pyelectasis of fetus on prenatal ultrasound - followed by MFM  3. Choroid plexus cyst of fetus in singleton pregnancy - followed by MFM   Preterm labor symptoms and general obstetric precautions including but not limited to vaginal bleeding, contractions, leaking of fluid and fetal  movement were reviewed in detail with the patient. Please refer to After Visit Summary for other counseling recommendations.   Follow up in 2 weeks   Brock Bad, MD  11/17/20

## 2020-11-18 ENCOUNTER — Ambulatory Visit: Payer: Medicaid Other | Admitting: *Deleted

## 2020-11-18 ENCOUNTER — Telehealth: Payer: Self-pay

## 2020-11-18 ENCOUNTER — Other Ambulatory Visit: Payer: Self-pay | Admitting: *Deleted

## 2020-11-18 ENCOUNTER — Encounter: Payer: Self-pay | Admitting: *Deleted

## 2020-11-18 ENCOUNTER — Ambulatory Visit: Payer: Medicaid Other | Attending: Obstetrics and Gynecology

## 2020-11-18 VITALS — BP 122/72 | HR 106

## 2020-11-18 DIAGNOSIS — O350XX0 Maternal care for (suspected) central nervous system malformation in fetus, not applicable or unspecified: Secondary | ICD-10-CM | POA: Insufficient documentation

## 2020-11-18 DIAGNOSIS — O3503X0 Maternal care for (suspected) central nervous system malformation or damage in fetus, choroid plexus cysts, not applicable or unspecified: Secondary | ICD-10-CM

## 2020-11-18 DIAGNOSIS — O283 Abnormal ultrasonic finding on antenatal screening of mother: Secondary | ICD-10-CM | POA: Diagnosis not present

## 2020-11-18 DIAGNOSIS — O289 Unspecified abnormal findings on antenatal screening of mother: Secondary | ICD-10-CM

## 2020-11-18 DIAGNOSIS — O99213 Obesity complicating pregnancy, third trimester: Secondary | ICD-10-CM

## 2020-11-18 DIAGNOSIS — O35EXX0 Maternal care for other (suspected) fetal abnormality and damage, fetal genitourinary anomalies, not applicable or unspecified: Secondary | ICD-10-CM

## 2020-11-18 DIAGNOSIS — O358XX0 Maternal care for other (suspected) fetal abnormality and damage, not applicable or unspecified: Secondary | ICD-10-CM | POA: Insufficient documentation

## 2020-11-18 DIAGNOSIS — Z362 Encounter for other antenatal screening follow-up: Secondary | ICD-10-CM

## 2020-11-18 DIAGNOSIS — Z3A31 31 weeks gestation of pregnancy: Secondary | ICD-10-CM

## 2020-11-18 DIAGNOSIS — E669 Obesity, unspecified: Secondary | ICD-10-CM

## 2020-11-18 LAB — HIV ANTIBODY (ROUTINE TESTING W REFLEX): HIV Screen 4th Generation wRfx: NONREACTIVE

## 2020-11-18 LAB — CBC
Hematocrit: 35.7 % (ref 34.0–46.6)
Hemoglobin: 12.1 g/dL (ref 11.1–15.9)
MCH: 29.9 pg (ref 26.6–33.0)
MCHC: 33.9 g/dL (ref 31.5–35.7)
MCV: 88 fL (ref 79–97)
Platelets: 347 10*3/uL (ref 150–450)
RBC: 4.05 x10E6/uL (ref 3.77–5.28)
RDW: 12.6 % (ref 11.7–15.4)
WBC: 10.1 10*3/uL (ref 3.4–10.8)

## 2020-11-18 LAB — RPR: RPR Ser Ql: NONREACTIVE

## 2020-11-18 NOTE — Telephone Encounter (Signed)
mar/patient called to advise she will be a few minutes late-explained may need to reschedule if not arrived by 3p.

## 2020-11-21 ENCOUNTER — Other Ambulatory Visit: Payer: Medicaid Other

## 2020-11-24 ENCOUNTER — Other Ambulatory Visit: Payer: Medicaid Other

## 2020-11-24 ENCOUNTER — Encounter: Payer: Medicaid Other | Admitting: Advanced Practice Midwife

## 2020-11-24 ENCOUNTER — Ambulatory Visit (INDEPENDENT_AMBULATORY_CARE_PROVIDER_SITE_OTHER): Payer: Medicaid Other | Admitting: Advanced Practice Midwife

## 2020-11-24 ENCOUNTER — Other Ambulatory Visit: Payer: Self-pay

## 2020-11-24 VITALS — BP 118/75 | HR 92 | Wt 190.0 lb

## 2020-11-24 DIAGNOSIS — R102 Pelvic and perineal pain unspecified side: Secondary | ICD-10-CM

## 2020-11-24 DIAGNOSIS — G479 Sleep disorder, unspecified: Secondary | ICD-10-CM

## 2020-11-24 DIAGNOSIS — O3503X0 Maternal care for (suspected) central nervous system malformation or damage in fetus, choroid plexus cysts, not applicable or unspecified: Secondary | ICD-10-CM

## 2020-11-24 DIAGNOSIS — O350XX0 Maternal care for (suspected) central nervous system malformation in fetus, not applicable or unspecified: Secondary | ICD-10-CM

## 2020-11-24 DIAGNOSIS — O26893 Other specified pregnancy related conditions, third trimester: Secondary | ICD-10-CM

## 2020-11-24 DIAGNOSIS — Z3480 Encounter for supervision of other normal pregnancy, unspecified trimester: Secondary | ICD-10-CM

## 2020-11-24 DIAGNOSIS — O35EXX0 Maternal care for other (suspected) fetal abnormality and damage, fetal genitourinary anomalies, not applicable or unspecified: Secondary | ICD-10-CM

## 2020-11-24 DIAGNOSIS — Z3A31 31 weeks gestation of pregnancy: Secondary | ICD-10-CM

## 2020-11-24 DIAGNOSIS — O358XX0 Maternal care for other (suspected) fetal abnormality and damage, not applicable or unspecified: Secondary | ICD-10-CM

## 2020-11-24 NOTE — Progress Notes (Signed)
Pt states she is having severe heartburn, request Rx.

## 2020-11-24 NOTE — Progress Notes (Signed)
   PRENATAL VISIT NOTE  Subjective:  Kim Powell is a 30 y.o. G2P1001 at [redacted]w[redacted]d being seen today for ongoing prenatal care.  She is currently monitored for the following issues for this low-risk pregnancy and has Encounter for supervision of normal pregnancy in first trimester; Choroid plexus cyst of fetus in singleton pregnancy; and Pyelectasis of fetus on prenatal ultrasound on their problem list.  Patient reports occasional contractions.  Contractions: Not present. Vag. Bleeding: None.  Movement: Present. Denies leaking of fluid.   The following portions of the patient's history were reviewed and updated as appropriate: allergies, current medications, past family history, past medical history, past social history, past surgical history and problem list.   Objective:   Vitals:   11/24/20 1048  BP: 118/75  Pulse: 92  Weight: 190 lb (86.2 kg)    Fetal Status: Fetal Heart Rate (bpm): 130   Movement: Present     General:  Alert, oriented and cooperative. Patient is in no acute distress.  Skin: Skin is warm and dry. No rash noted.   Cardiovascular: Normal heart rate noted  Respiratory: Normal respiratory effort, no problems with respiration noted  Abdomen: Soft, gravid, appropriate for gestational age.  Pain/Pressure: Present     Pelvic: Cervical exam deferred        Extremities: Normal range of motion.     Mental Status: Normal mood and affect. Normal behavior. Normal judgment and thought content.   Assessment and Plan:  Pregnancy: G2P1001 at [redacted]w[redacted]d 1. Supervision of other normal pregnancy, antepartum --Anticipatory guidance about next visits/weeks of pregnancy given. --Next visit in 2 weeks  - Glucose Tolerance, 2 Hours w/1 Hour  2. Pyelectasis of fetus on prenatal ultrasound --UTD resolved with last [place]  3. [redacted] weeks gestation of pregnancy   4. Choroid plexus cyst of fetus in singleton pregnancy --NIPS wnl, CPC resolved  5. Sleep disturbance --Discussed ways to improve  sleep including warm bath/shower before bed, relaxation strategies.  Ok to use Tylenol and/or Benadryl PRN.  6. Pelvic pressure in pregnancy, antepartum, third trimester --Pt reports intermittent pelvic pressure, improved significantly with support belt --Rest/ice/heat/warm bath/increase PO fluids/Tylenol/pregnancy support belt   Preterm labor symptoms and general obstetric precautions including but not limited to vaginal bleeding, contractions, leaking of fluid and fetal movement were reviewed in detail with the patient. Please refer to After Visit Summary for other counseling recommendations.   Return in about 2 weeks (around 12/08/2020).  Future Appointments  Date Time Provider Department Center  12/22/2020  3:30 PM [facility] NURSE [facility]  12/22/2020  3:45 PM [facility] US1 [facility]    Sharen Counter, CNM

## 2020-11-25 LAB — GLUCOSE TOLERANCE, 2 HOURS W/ 1HR
Glucose, 1 hour: 59 mg/dL — ABNORMAL LOW (ref 65–179)
Glucose, 2 hour: 66 mg/dL (ref 65–152)
Glucose, Fasting: 75 mg/dL (ref 65–91)

## 2020-12-09 ENCOUNTER — Ambulatory Visit (INDEPENDENT_AMBULATORY_CARE_PROVIDER_SITE_OTHER): Payer: Medicaid Other | Admitting: Advanced Practice Midwife

## 2020-12-09 ENCOUNTER — Other Ambulatory Visit: Payer: Self-pay

## 2020-12-09 VITALS — BP 128/82 | HR 105 | Wt 193.8 lb

## 2020-12-09 DIAGNOSIS — Z3A34 34 weeks gestation of pregnancy: Secondary | ICD-10-CM

## 2020-12-09 DIAGNOSIS — Z3480 Encounter for supervision of other normal pregnancy, unspecified trimester: Secondary | ICD-10-CM

## 2020-12-09 NOTE — Progress Notes (Signed)
   PRENATAL VISIT NOTE  Subjective:  Kim Powell is a 30 y.o. G2P1001 at [redacted]w[redacted]d being seen today for ongoing prenatal care.  She is currently monitored for the following issues for this low-risk pregnancy and has Encounter for supervision of normal pregnancy in first trimester; Choroid plexus cyst of fetus in singleton pregnancy; and Pyelectasis of fetus on prenatal ultrasound on their problem list.  Patient reports no complaints.  Contractions: Not present. Vag. Bleeding: None.  Movement: Present. Denies leaking of fluid.   The following portions of the patient's history were reviewed and updated as appropriate: allergies, current medications, past family history, past medical history, past social history, past surgical history and problem list.   Objective:   Vitals:   12/09/20 1618  BP: 128/82  Pulse: (!) 105  Weight: 193 lb 12.8 oz (87.9 kg)    Fetal Status: Fetal Heart Rate (bpm): 142   Movement: Present     General:  Alert, oriented and cooperative. Patient is in no acute distress.  Skin: Skin is warm and dry. No rash noted.   Cardiovascular: Normal heart rate noted  Respiratory: Normal respiratory effort, no problems with respiration noted  Abdomen: Soft, gravid, appropriate for gestational age.  Pain/Pressure: Absent     Pelvic: Cervical exam deferred        Extremities: Normal range of motion.     Mental Status: Normal mood and affect. Normal behavior. Normal judgment and thought content.   Assessment and Plan:  Pregnancy: G2P1001 at [redacted]w[redacted]d 1. Supervision of other normal pregnancy, antepartum --Anticipatory guidance about next visits/weeks of pregnancy given. --next visit in 2 weeks  2. [redacted] weeks gestation of pregnancy   Preterm labor symptoms and general obstetric precautions including but not limited to vaginal bleeding, contractions, leaking of fluid and fetal movement were reviewed in detail with the patient. Please refer to After Visit Summary for other counseling  recommendations.   Return in about 2 weeks (around 12/23/2020).  Future Appointments  Date Time Provider Department Center  12/22/2020  3:30 PM [facility]URSE [facility]  12/22/2020  3:45 PM [facility] US1 [facility]    Sharen Counter, CNM

## 2020-12-22 ENCOUNTER — Ambulatory Visit: Payer: Medicaid Other | Admitting: *Deleted

## 2020-12-22 ENCOUNTER — Encounter: Payer: Self-pay | Admitting: *Deleted

## 2020-12-22 ENCOUNTER — Ambulatory Visit (INDEPENDENT_AMBULATORY_CARE_PROVIDER_SITE_OTHER): Payer: Medicaid Other | Admitting: Advanced Practice Midwife

## 2020-12-22 ENCOUNTER — Ambulatory Visit: Payer: Medicaid Other | Attending: Maternal & Fetal Medicine

## 2020-12-22 ENCOUNTER — Other Ambulatory Visit: Payer: Self-pay

## 2020-12-22 ENCOUNTER — Other Ambulatory Visit (HOSPITAL_COMMUNITY)
Admission: RE | Admit: 2020-12-22 | Discharge: 2020-12-22 | Disposition: A | Discharge status: Home / Self Care | Payer: Medicaid Other | Source: Ambulatory Visit | Attending: Obstetrics and Gynecology | Admitting: Obstetrics and Gynecology

## 2020-12-22 VITALS — BP 130/79 | HR 98

## 2020-12-22 VITALS — BP 132/86 | HR 107 | Wt 195.0 lb

## 2020-12-22 DIAGNOSIS — O35EXX0 Maternal care for other (suspected) fetal abnormality and damage, fetal genitourinary anomalies, not applicable or unspecified: Secondary | ICD-10-CM

## 2020-12-22 DIAGNOSIS — E669 Obesity, unspecified: Secondary | ICD-10-CM

## 2020-12-22 DIAGNOSIS — Z362 Encounter for other antenatal screening follow-up: Secondary | ICD-10-CM | POA: Diagnosis not present

## 2020-12-22 DIAGNOSIS — O283 Abnormal ultrasonic finding on antenatal screening of mother: Secondary | ICD-10-CM

## 2020-12-22 DIAGNOSIS — O350XX0 Maternal care for (suspected) central nervous system malformation in fetus, not applicable or unspecified: Secondary | ICD-10-CM | POA: Insufficient documentation

## 2020-12-22 DIAGNOSIS — Z3480 Encounter for supervision of other normal pregnancy, unspecified trimester: Secondary | ICD-10-CM | POA: Diagnosis present

## 2020-12-22 DIAGNOSIS — O358XX0 Maternal care for other (suspected) fetal abnormality and damage, not applicable or unspecified: Secondary | ICD-10-CM | POA: Insufficient documentation

## 2020-12-22 DIAGNOSIS — Z3A35 35 weeks gestation of pregnancy: Secondary | ICD-10-CM

## 2020-12-22 DIAGNOSIS — O3503X0 Maternal care for (suspected) central nervous system malformation or damage in fetus, choroid plexus cysts, not applicable or unspecified: Secondary | ICD-10-CM

## 2020-12-22 DIAGNOSIS — O99213 Obesity complicating pregnancy, third trimester: Secondary | ICD-10-CM

## 2020-12-22 DIAGNOSIS — O289 Unspecified abnormal findings on antenatal screening of mother: Secondary | ICD-10-CM | POA: Diagnosis not present

## 2020-12-22 NOTE — Patient Instructions (Signed)
Things to Try After 37 weeks to Encourage Labor/Get Ready for Labor:    Try the Miles Circuit at www.milescircuit.com daily to improve baby's position and encourage the onset of labor.  Walk a little and rest a little every day.  Change positions often.  Cervical Ripening: May try one or both Red Raspberry Leaf capsules or tea:  two 300mg or 400mg tablets with each meal, 2-3 times a day, or 1-3 cups of tea daily  Potential Side Effects Of Raspberry Leaf:  Most women do not experience any side effects from drinking raspberry leaf tea. However, nausea and loose stools are possible   Evening Primrose Oil capsules: take 1 capsule by mouth and place one capsule in the vagina every night.    Some of the potential side effects:  Upset stomach  Loose stools or diarrhea  Headaches  Nausea  Sex can also help the cervix ripen and encourage labor onset.    Labor Precautions Reasons to come to MAU at [facility]:  1.  Contractions are  5 minutes apart or less, each last 1 minute, these have been going on for 1-2 hours, and you cannot walk or talk during them 2.  You have a large gush of fluid, or a trickle of fluid that will not stop and you have to wear a pad 3.  You have bleeding that is bright red, heavier than spotting--like menstrual bleeding (spotting can be normal in early labor or after a check of your cervix) 4.  You do not feel the baby moving like he/she normally does  

## 2020-12-22 NOTE — Progress Notes (Signed)
   PRENATAL VISIT NOTE  Subjective:  Kim Powell is a 30 y.o. G2P1001 at [redacted]w[redacted]d being seen today for ongoing prenatal care.  She is currently monitored for the following issues for this low-risk pregnancy and has Encounter for supervision of normal pregnancy in first trimester; Choroid plexus cyst of fetus in singleton pregnancy; and Pyelectasis of fetus on prenatal ultrasound on their problem list.  Patient reports no complaints.  Contractions: Not present. Vag. Bleeding: None.  Movement: Present. Denies leaking of fluid.   The following portions of the patient's history were reviewed and updated as appropriate: allergies, current medications, past family history, past medical history, past social history, past surgical history and problem list.   Objective:   Vitals:   12/22/20 1431  BP: 132/86  Pulse: (!) 107  Weight: 195 lb (88.5 kg)    Fetal Status: Fetal Heart Rate (bpm): 142 Fundal Height: 36 cm Movement: Present     General:  Alert, oriented and cooperative. Patient is in no acute distress.  Skin: Skin is warm and dry. No rash noted.   Cardiovascular: Normal heart rate noted  Respiratory: Normal respiratory effort, no problems with respiration noted  Abdomen: Soft, gravid, appropriate for gestational age.  Pain/Pressure: Absent     Pelvic: Cervical exam performed in the presence of a chaperone        Extremities: Normal range of motion.     Mental Status: Normal mood and affect. Normal behavior. Normal judgment and thought content.   Assessment and Plan:  Pregnancy: G2P1001 at [redacted]w[redacted]d 1. Supervision of other normal pregnancy, antepartum --Anticipatory guidance about next visits/weeks of pregnancy given. --Next appt in 2 weeks  --GBS/GC collected today --Reviewed labor readiness/signs of labor  2. [redacted] weeks gestation of pregnancy   Preterm labor symptoms and general obstetric precautions including but not limited to vaginal bleeding, contractions, leaking of fluid and  fetal movement were reviewed in detail with the patient. Please refer to After Visit Summary for other counseling recommendations.   Return in about 2 weeks (around 01/05/2021).  Future Appointments  Date Time Provider Department Center  12/22/2020  3:30 PM [facility] NURSE [facility]  12/22/2020  3:45 PM [facility] US1 [facility]    Sharen Counter, CNM

## 2020-12-23 LAB — GC/CHLAMYDIA PROBE AMP (~~LOC~~) NOT AT ARMC
Chlamydia: NEGATIVE
Comment: NEGATIVE
Comment: NORMAL
Neisseria Gonorrhea: NEGATIVE

## 2020-12-25 ENCOUNTER — Encounter: Payer: Self-pay | Admitting: Advanced Practice Midwife

## 2020-12-25 DIAGNOSIS — O99820 Streptococcus B carrier state complicating pregnancy: Secondary | ICD-10-CM | POA: Insufficient documentation

## 2020-12-25 LAB — CULTURE, BETA STREP (GROUP B ONLY): Strep Gp B Culture: POSITIVE — AB

## 2021-01-05 ENCOUNTER — Encounter: Payer: Medicaid Other | Admitting: Obstetrics and Gynecology

## 2021-01-12 ENCOUNTER — Other Ambulatory Visit: Payer: Self-pay

## 2021-01-12 ENCOUNTER — Ambulatory Visit (INDEPENDENT_AMBULATORY_CARE_PROVIDER_SITE_OTHER): Payer: Medicaid Other | Admitting: Advanced Practice Midwife

## 2021-01-12 VITALS — BP 136/85 | HR 90 | Wt 196.0 lb

## 2021-01-12 DIAGNOSIS — O35EXX0 Maternal care for other (suspected) fetal abnormality and damage, fetal genitourinary anomalies, not applicable or unspecified: Secondary | ICD-10-CM

## 2021-01-12 DIAGNOSIS — Z3480 Encounter for supervision of other normal pregnancy, unspecified trimester: Secondary | ICD-10-CM

## 2021-01-12 DIAGNOSIS — Z3A38 38 weeks gestation of pregnancy: Secondary | ICD-10-CM

## 2021-01-12 DIAGNOSIS — O358XX0 Maternal care for other (suspected) fetal abnormality and damage, not applicable or unspecified: Secondary | ICD-10-CM

## 2021-01-12 NOTE — Progress Notes (Signed)
   PRENATAL VISIT NOTE  Subjective:  Kim Powell is a 30 y.o. G2P1001 at [redacted]w[redacted]d being seen today for ongoing prenatal care.  She is currently monitored for the following issues for this low-risk pregnancy and has Encounter for supervision of normal pregnancy in first trimester; Choroid plexus cyst of fetus in singleton pregnancy; Pyelectasis of fetus on prenatal ultrasound; and GBS (group B Streptococcus carrier), +RV culture, currently pregnant on their problem list.  Patient reports occasional contractions.  Contractions: Not present. Vag. Bleeding: None.  Movement: Present. Denies leaking of fluid.   The following portions of the patient's history were reviewed and updated as appropriate: allergies, current medications, past family history, past medical history, past social history, past surgical history and problem list.   Objective:   Vitals:   01/12/21 1621  BP: 136/85  Pulse: 90  Weight: 196 lb (88.9 kg)    Fetal Status: Fetal Heart Rate (bpm): 132   Movement: Present     General:  Alert, oriented and cooperative. Patient is in no acute distress.  Skin: Skin is warm and dry. No rash noted.   Cardiovascular: Normal heart rate noted  Respiratory: Normal respiratory effort, no problems with respiration noted  Abdomen: Soft, gravid, appropriate for gestational age.  Pain/Pressure: Present     Pelvic: Cervical exam performed in the presence of a chaperone        Extremities: Normal range of motion.  Edema: Trace  Mental Status: Normal mood and affect. Normal behavior. Normal judgment and thought content.   Assessment and Plan:  Pregnancy: G2P1001 at [redacted]w[redacted]d 1. Supervision of other normal pregnancy, antepartum --Anticipatory guidance about next visits/weeks of pregnancy given. --Next visit in 1 week  2. [redacted] weeks gestation of pregnancy   3. Pyelectasis of fetus on prenatal ultrasound --Persists bilaterally on [place] 12/22/20. F/U US of infant after delivery recommended.  Term labor  symptoms and general obstetric precautions including but not limited to vaginal bleeding, contractions, leaking of fluid and fetal movement were reviewed in detail with the patient. Please refer to After Visit Summary for other counseling recommendations.   Return in about 1 week (around 01/19/2021).  Future Appointments  Date Time Provider Department Center  01/20/2021  4:10 PM [place], Wilmer Floor, CNM [facility] None    Sharen Counter, CNM

## 2021-01-12 NOTE — Progress Notes (Signed)
ROB 38.6 wks Request SVE

## 2021-01-12 NOTE — Patient Instructions (Addendum)
Things to Try After 37 weeks to Encourage Labor/Get Ready for Labor:    Try the Miles Circuit at www.milescircuit.com daily to improve baby's position and encourage the onset of labor.  Walk a little and rest a little every day.  Change positions often.  Cervical Ripening: May try one or both Red Raspberry Leaf capsules or tea:  two 300mg or 400mg tablets with each meal, 2-3 times a day, or 1-3 cups of tea daily  Potential Side Effects Of Raspberry Leaf:  Most women do not experience any side effects from drinking raspberry leaf tea. However, nausea and loose stools are possible   Evening Primrose Oil capsules: take 1 capsule by mouth and place one capsule in the vagina every night.    Some of the potential side effects:  Upset stomach  Loose stools or diarrhea  Headaches  Nausea  Sex can also help the cervix ripen and encourage labor onset.    Labor Precautions Reasons to come to MAU at [facility]:  1.  Contractions are  5 minutes apart or less, each last 1 minute, these have been going on for 1-2 hours, and you cannot walk or talk during them 2.  You have a large gush of fluid, or a trickle of fluid that will not stop and you have to wear a pad 3.  You have bleeding that is bright red, heavier than spotting--like menstrual bleeding (spotting can be normal in early labor or after a check of your cervix) 4.  You do not feel the baby moving like he/she normally does  

## 2021-01-20 ENCOUNTER — Ambulatory Visit (INDEPENDENT_AMBULATORY_CARE_PROVIDER_SITE_OTHER): Payer: Medicaid Other | Admitting: Advanced Practice Midwife

## 2021-01-20 ENCOUNTER — Other Ambulatory Visit: Payer: Self-pay

## 2021-01-20 VITALS — BP 133/87 | HR 108 | Wt 197.0 lb

## 2021-01-20 DIAGNOSIS — O3503X0 Maternal care for (suspected) central nervous system malformation or damage in fetus, choroid plexus cysts, not applicable or unspecified: Secondary | ICD-10-CM

## 2021-01-20 DIAGNOSIS — O358XX0 Maternal care for other (suspected) fetal abnormality and damage, not applicable or unspecified: Secondary | ICD-10-CM

## 2021-01-20 DIAGNOSIS — O35EXX0 Maternal care for other (suspected) fetal abnormality and damage, fetal genitourinary anomalies, not applicable or unspecified: Secondary | ICD-10-CM

## 2021-01-20 DIAGNOSIS — Z3480 Encounter for supervision of other normal pregnancy, unspecified trimester: Secondary | ICD-10-CM

## 2021-01-20 DIAGNOSIS — Z3A40 40 weeks gestation of pregnancy: Secondary | ICD-10-CM

## 2021-01-20 DIAGNOSIS — Z3481 Encounter for supervision of other normal pregnancy, first trimester: Secondary | ICD-10-CM

## 2021-01-20 DIAGNOSIS — O99820 Streptococcus B carrier state complicating pregnancy: Secondary | ICD-10-CM

## 2021-01-20 DIAGNOSIS — O350XX0 Maternal care for (suspected) central nervous system malformation in fetus, not applicable or unspecified: Secondary | ICD-10-CM

## 2021-01-20 NOTE — Progress Notes (Signed)
   PRENATAL VISIT NOTE  Subjective:  Kim Powell is a 30 y.o. G2P1001 at [redacted]w[redacted]d being seen today for ongoing prenatal care.  She is currently monitored for the following issues for this low-risk pregnancy and has Encounter for supervision of normal pregnancy in first trimester; Choroid plexus cyst of fetus in singleton pregnancy; Pyelectasis of fetus on prenatal ultrasound; and GBS (group B Streptococcus carrier), +RV culture, currently pregnant on their problem list.  Patient reports occasional contractions.  Contractions: Not present. Vag. Bleeding: None.  Movement: Present. Denies leaking of fluid.   The following portions of the patient's history were reviewed and updated as appropriate: allergies, current medications, past family history, past medical history, past social history, past surgical history and problem list.   Objective:   Vitals:   01/20/21 1617  BP: 133/87  Pulse: (!) 108  Weight: 197 lb (89.4 kg)    Fetal Status: Fetal Heart Rate (bpm): NST-R Fundal Height: 39 cm Movement: Present  Presentation: Vertex  General:  Alert, oriented and cooperative. Patient is in no acute distress.  Skin: Skin is warm and dry. No rash noted.   Cardiovascular: Normal heart rate noted  Respiratory: Normal respiratory effort, no problems with respiration noted  Abdomen: Soft, gravid, appropriate for gestational age.  Pain/Pressure: Present     Pelvic: Cervical exam performed in the presence of a chaperone Dilation: 1 Effacement (%): 50 Station: -3  Extremities: Normal range of motion.     Mental Status: Normal mood and affect. Normal behavior. Normal judgment and thought content.   Assessment and Plan:  Pregnancy: G2P1001 at [redacted]w[redacted]d 1. Supervision of other normal pregnancy, antepartum --Anticipatory guidance about next visits/weeks of pregnancy given. --IOL scheduled for 41 weeks, orders placed --NST-R today --Reviewed labor readiness, [employer], raspberry tea, etc.  --Membranes  swept at pt request  2. Pyelectasis of fetus on prenatal ultrasound   3. [redacted] weeks gestation of pregnancy   Term labor symptoms and general obstetric precautions including but not limited to vaginal bleeding, contractions, leaking of fluid and fetal movement were reviewed in detail with the patient. Please refer to After Visit Summary for other counseling recommendations.   No follow-ups on file.  No future appointments.   Sharen Counter, CNM

## 2021-01-20 NOTE — Patient Instructions (Signed)
Things to Try After 37 weeks to Encourage Labor/Get Ready for Labor:    Try the Miles Circuit at www.milescircuit.com daily to improve baby's position and encourage the onset of labor.  Walk a little and rest a little every day.  Change positions often.  Cervical Ripening: May try one or both Red Raspberry Leaf capsules or tea:  two 300mg or 400mg tablets with each meal, 2-3 times a day, or 1-3 cups of tea daily  Potential Side Effects Of Raspberry Leaf:  Most women do not experience any side effects from drinking raspberry leaf tea. However, nausea and loose stools are possible   Evening Primrose Oil capsules: take 1 capsule by mouth and place one capsule in the vagina every night.    Some of the potential side effects:  Upset stomach  Loose stools or diarrhea  Headaches  Nausea  Sex can also help the cervix ripen and encourage labor onset.    Labor Precautions Reasons to come to MAU at [facility]:  1.  Contractions are  5 minutes apart or less, each last 1 minute, these have been going on for 1-2 hours, and you cannot walk or talk during them 2.  You have a large gush of fluid, or a trickle of fluid that will not stop and you have to wear a pad 3.  You have bleeding that is bright red, heavier than spotting--like menstrual bleeding (spotting can be normal in early labor or after a check of your cervix) 4.  You do not feel the baby moving like he/she normally does  

## 2021-01-21 ENCOUNTER — Telehealth (HOSPITAL_COMMUNITY): Payer: Self-pay | Admitting: *Deleted

## 2021-01-21 ENCOUNTER — Other Ambulatory Visit: Payer: Self-pay | Admitting: Advanced Practice Midwife

## 2021-01-21 NOTE — Telephone Encounter (Signed)
Preadmission screen  

## 2021-01-22 ENCOUNTER — Encounter (HOSPITAL_COMMUNITY): Payer: Self-pay | Admitting: *Deleted

## 2021-01-22 ENCOUNTER — Encounter (HOSPITAL_COMMUNITY): Payer: Self-pay

## 2021-01-22 ENCOUNTER — Telehealth (HOSPITAL_COMMUNITY): Payer: Self-pay | Admitting: *Deleted

## 2021-01-22 NOTE — Telephone Encounter (Signed)
Preadmission screen  

## 2021-01-26 ENCOUNTER — Other Ambulatory Visit: Payer: Self-pay | Admitting: Advanced Practice Midwife

## 2021-01-27 LAB — SARS CORONAVIRUS 2 (TAT 6-24 HRS): SARS Coronavirus 2: NEGATIVE

## 2021-01-28 ENCOUNTER — Inpatient Hospital Stay (HOSPITAL_COMMUNITY): Payer: Medicaid Other | Attending: Obstetrics & Gynecology

## 2021-01-28 ENCOUNTER — Inpatient Hospital Stay (HOSPITAL_COMMUNITY)
Admission: AD | Admit: 2021-01-28 | Payer: Medicaid Other | Source: Home / Self Care | Admitting: Obstetrics & Gynecology

## 2021-01-29 ENCOUNTER — Encounter (HOSPITAL_COMMUNITY): Payer: Self-pay | Admitting: Obstetrics & Gynecology

## 2021-01-29 ENCOUNTER — Inpatient Hospital Stay (HOSPITAL_COMMUNITY)
Admission: AD | Admit: 2021-01-29 | Discharge: 2021-01-31 | DRG: 807 | Disposition: A | Discharge status: Home / Self Care | Payer: Medicaid Other | Attending: Obstetrics and Gynecology | Admitting: Obstetrics and Gynecology

## 2021-01-29 ENCOUNTER — Other Ambulatory Visit: Payer: Self-pay

## 2021-01-29 DIAGNOSIS — O3663X0 Maternal care for excessive fetal growth, third trimester, not applicable or unspecified: Secondary | ICD-10-CM | POA: Diagnosis present

## 2021-01-29 DIAGNOSIS — O358XX0 Maternal care for other (suspected) fetal abnormality and damage, not applicable or unspecified: Secondary | ICD-10-CM | POA: Diagnosis present

## 2021-01-29 DIAGNOSIS — O99820 Streptococcus B carrier state complicating pregnancy: Secondary | ICD-10-CM

## 2021-01-29 DIAGNOSIS — O35EXX0 Maternal care for other (suspected) fetal abnormality and damage, fetal genitourinary anomalies, not applicable or unspecified: Secondary | ICD-10-CM

## 2021-01-29 DIAGNOSIS — O3503X0 Maternal care for (suspected) central nervous system malformation or damage in fetus, choroid plexus cysts, not applicable or unspecified: Secondary | ICD-10-CM

## 2021-01-29 DIAGNOSIS — O99824 Streptococcus B carrier state complicating childbirth: Secondary | ICD-10-CM | POA: Diagnosis present

## 2021-01-29 DIAGNOSIS — O350XX0 Maternal care for (suspected) central nervous system malformation in fetus, not applicable or unspecified: Principal | ICD-10-CM

## 2021-01-29 DIAGNOSIS — Z3A41 41 weeks gestation of pregnancy: Secondary | ICD-10-CM

## 2021-01-29 DIAGNOSIS — O480 Post-term pregnancy: Principal | ICD-10-CM | POA: Diagnosis present

## 2021-01-29 DIAGNOSIS — Z3481 Encounter for supervision of other normal pregnancy, first trimester: Secondary | ICD-10-CM

## 2021-01-29 LAB — COMPREHENSIVE METABOLIC PANEL
ALT: 15 U/L (ref 0–44)
AST: 23 U/L (ref 15–41)
Albumin: 2.8 g/dL — ABNORMAL LOW (ref 3.5–5.0)
Alkaline Phosphatase: 159 U/L — ABNORMAL HIGH (ref 38–126)
Anion gap: 9 (ref 5–15)
BUN: 7 mg/dL (ref 6–20)
CO2: 17 mmol/L — ABNORMAL LOW (ref 22–32)
Calcium: 8.8 mg/dL — ABNORMAL LOW (ref 8.9–10.3)
Chloride: 108 mmol/L (ref 98–111)
Creatinine, Ser: 0.63 mg/dL (ref 0.44–1.00)
GFR, Estimated: >60 mL/min (ref >60)
Glucose, Bld: 97 mg/dL (ref 70–99)
Potassium: 3.9 mmol/L (ref 3.5–5.1)
Sodium: 134 mmol/L — ABNORMAL LOW (ref 135–145)
Total Bilirubin: 0.4 mg/dL (ref 0.3–1.2)
Total Protein: 6.9 g/dL (ref 6.5–8.1)

## 2021-01-29 LAB — CBC
HCT: 37.8 % (ref 36.0–46.0)
Hemoglobin: 12.9 g/dL (ref 12.0–15.0)
MCH: 30.1 pg (ref 26.0–34.0)
MCHC: 34.1 g/dL (ref 30.0–36.0)
MCV: 88.1 fL (ref 80.0–100.0)
Platelets: 332 10*3/uL (ref 150–400)
RBC: 4.29 MIL/uL (ref 3.87–5.11)
RDW: 13.2 % (ref 11.5–15.5)
WBC: 16.2 10*3/uL — ABNORMAL HIGH (ref 4.0–10.5)
nRBC: 0 % (ref 0.0–0.2)

## 2021-01-29 LAB — PROTEIN / CREATININE RATIO, URINE
Creatinine, Urine: 61.59 mg/dL
Protein Creatinine Ratio: 0.21 mg/mg{Cre} — ABNORMAL HIGH (ref 0.00–0.15)
Total Protein, Urine: 13 mg/dL

## 2021-01-29 LAB — RPR: RPR Ser Ql: NONREACTIVE

## 2021-01-29 LAB — TYPE AND SCREEN
ABO/RH(D): O POS
Antibody Screen: NEGATIVE

## 2021-01-29 MED ORDER — MISOPROSTOL 50MCG HALF TABLET
50.0000 ug | ORAL_TABLET | ORAL | Status: DC | PRN
Start: 2021-01-29 — End: 2021-01-30

## 2021-01-29 MED ORDER — LIDOCAINE HCL (PF) 1 % IJ SOLN: 30.0000 mL | INTRAMUSCULAR | Status: DC | PRN

## 2021-01-29 MED ORDER — SODIUM CHLORIDE 0.9 % IV SOLN
5.0000 10*6.[IU] | Freq: Once | INTRAVENOUS | Status: AC
  Administered 2021-01-29: 5 10*6.[IU] via INTRAVENOUS
  Filled 2021-01-29: qty 5

## 2021-01-29 MED ORDER — ACETAMINOPHEN 325 MG PO TABS: 650.0000 mg | ORAL_TABLET | ORAL | Status: DC | PRN

## 2021-01-29 MED ORDER — OXYTOCIN BOLUS FROM INFUSION
333.0000 mL | Freq: Once | INTRAVENOUS | Status: AC
  Administered 2021-01-30: 333 mL via INTRAVENOUS

## 2021-01-29 MED ORDER — OXYCODONE-ACETAMINOPHEN 5-325 MG PO TABS: 1.0000 | ORAL_TABLET | ORAL | Status: DC | PRN

## 2021-01-29 MED ORDER — LACTATED RINGERS IV SOLN: 500.0000 mL | INTRAVENOUS | Status: DC | PRN

## 2021-01-29 MED ORDER — TERBUTALINE SULFATE 1 MG/ML IJ SOLN: 0.2500 mg | Freq: Once | INTRAMUSCULAR | Status: DC | PRN

## 2021-01-29 MED ORDER — LACTATED RINGERS IV SOLN: INTRAVENOUS | Status: DC

## 2021-01-29 MED ORDER — SOD CITRATE-CITRIC ACID 500-334 MG/5ML PO SOLN: 30.0000 mL | ORAL | Status: DC | PRN

## 2021-01-29 MED ORDER — PENICILLIN G POT IN DEXTROSE 60000 UNIT/ML IV SOLN
3.0000 10*6.[IU] | INTRAVENOUS | Status: DC
  Administered 2021-01-29 – 2021-01-30 (×5): 3 10*6.[IU] via INTRAVENOUS
  Filled 2021-01-29 (×5): qty 50

## 2021-01-29 MED ORDER — OXYCODONE-ACETAMINOPHEN 5-325 MG PO TABS: 2.0000 | ORAL_TABLET | ORAL | Status: DC | PRN

## 2021-01-29 MED ORDER — OXYTOCIN-SODIUM CHLORIDE 30-0.9 UT/500ML-% IV SOLN
2.5000 [IU]/h | INTRAVENOUS | Status: DC
  Administered 2021-01-30: 2.5 [IU]/h via INTRAVENOUS
  Filled 2021-01-29: qty 500

## 2021-01-29 MED ORDER — ONDANSETRON HCL 4 MG/2ML IJ SOLN: 4.0000 mg | Freq: Four times a day (QID) | INTRAMUSCULAR | Status: DC | PRN

## 2021-01-29 NOTE — H&P (Addendum)
OBSTETRIC ADMISSION HISTORY AND PHYSICAL  Kim Powell is a 30 y.o. female G2P1001 with IUP at 76w2dby 11wk [place] for SOL. She reports +FMs, No LOF, no VB, no blurry vision, headaches or peripheral edema, and RUQ pain.  She plans on breast feeding. She is undecided regarding birth control but says she will have a plan by 6 week follow up. She received her prenatal care at  [facility] 11[facility] [place]--->  Estimated Date of Delivery: 01/20/21  Sono:    @[redacted]w[redacted]d , CWD, normal anatomy, Cephalic presentation, [id number] 90% EFW    Prenatal History/Complications:  -Bilateral UTD on [place] will need infant renal [place] delivery - CAsh Flat; resolved - GBS carrier  Past Medical History: Past Medical History:  Diagnosis Date   Asthma     Past Surgical History: Past Surgical History:  Procedure Laterality Date   NO PAST SURGERIES      Obstetrical History: OB History     Gravida  2   Para  1   Term  1   Preterm      AB      Living  1      SAB      IAB      Ectopic      Multiple  0   Live Births  1           Social History Social History   Socioeconomic History   Marital status: Single    Spouse name: KCheral Almas  Number of children: 1   Years of education: Not on file   Highest education level: Not on file  Occupational History   Not on file  Tobacco Use   Smoking status: Never   Smokeless tobacco: Never  Vaping Use   Vaping Use: Never used  Substance and Sexual Activity   Alcohol use: Not Currently    Comment: Last drink in November   Drug use: No   Sexual activity: Yes    Partners: Male    Birth control/protection: None  Other Topics Concern   Not on file  Social History Narrative   Not on file   Social Determinants of Health   Financial Resource Strain: Not on file  Food Insecurity: Not on file  Transportation Needs: Not on file  Physical Activity: Not on file  Stress: Not on file  Social Connections: Not on file    Family  History: Family History  Problem Relation Age of Onset   Heart attack Father    Cancer Maternal Grandmother     Allergies: No Known Allergies  Medications Prior to Admission  Medication Sig Dispense Refill Last Dose   Prenatal Vit-Fe Fumarate-FA (PRENATAL MULTIVITAMIN) TABS tablet Take 1 tablet by mouth daily at 12 noon.   01/29/2021   acetaminophen (TYLENOL) 500 MG tablet Take 500 mg by mouth every 6 (six) hours as needed. (Patient not taking: No sig reported)      albuterol (PROVENTIL HFA;VENTOLIN HFA) 108 (90 Base) MCG/ACT inhaler Inhale 2 puffs into the lungs every 6 (six) hours as needed for wheezing or shortness of breath. (Patient not taking: No sig reported)      Blood Pressure Monitoring (BLOOD PRESSURE KIT) DEVI 1 kit by Does not apply route once a week. (Patient not taking: Reported on 12/22/2020) 1 each 0    Doxylamine-Pyridoxine (DICLEGIS) 10-10 MG TBEC Take 2 tablets by mouth at bedtime. If symptoms persist, add one tablet in the morning and one  in the afternoon (Patient not taking: No sig reported) 100 tablet 5    lidocaine (XYLOCAINE) 2 % solution Use as directed 15 mLs in the mouth or throat as needed for mouth pain. (Patient not taking: No sig reported) 100 mL 1    triamcinolone ointment (KENALOG) 0.5 % Apply 1 application topically 2 (two) times daily. (Patient not taking: No sig reported) 30 g 1      Review of Systems   All systems reviewed and negative except as stated in HPI  Blood pressure (!) 135/92, pulse (!) 107, temperature 98.2 F (36.8 C), resp. rate 18, last menstrual period 04/23/2020, unknown if currently breastfeeding. General appearance: alert, cooperative, and no distress Lungs: clear to auscultation bilaterally Heart: regular rate and rhythm Abdomen: soft, non-tender; bowel sounds normal Extremities: Homans sign is negative, no sign of DVT Presentation: cephalic Fetal monitoringBaseline: 130 bpm, Variability: Good {> 6 bpm), and Decelerations:  Early Uterine activitycontracting every 3-4 minutes Dilation: 2.5 Effacement (%): 60 Station: -3 Exam by:: K.Wilson,RN  Prenatal labs: ABO, Rh: --/--/O POS (08/25 3536) Antibody: NEG (08/25 0727) Rubella: 3.04 (02/07 1437) RPR: Non Reactive (06/13 1516)  HBsAg: Negative (02/07 1437)  HIV: Non Reactive (06/13 1516)  GBS: Positive/-- (07/18 0257)  1 hr Glucola WNL Genetic screening  Neg Anatomy US Bilateral UTD  Prenatal Transfer Tool  Maternal Diabetes: No Genetic Screening: Normal Maternal Ultrasounds/Referrals: Other:Dilated bilateral UTD Fetal Ultrasounds or other Referrals:  None Maternal Substance Abuse:  No Significant Maternal Medications:  None Significant Maternal Lab Results: Group B Strep positive  Results for orders placed or performed during the hospital encounter of 01/29/21 (from the past 24 hour(s))  CBC   Collection Time: 01/29/21  7:27 AM  Result Value Ref Range   WBC 16.2 (H) 4.0 - 10.5 K/uL   RBC 4.29 3.87 - 5.11 MIL/uL   Hemoglobin 12.9 12.0 - 15.0 g/dL   HCT 37.8 36.0 - 46.0 %   MCV 88.1 80.0 - 100.0 fL   MCH 30.1 26.0 - 34.0 pg   MCHC 34.1 30.0 - 36.0 g/dL   RDW 13.2 11.5 - 15.5 %   Platelets 332 150 - 400 K/uL   nRBC 0.0 0.0 - 0.2 %  Type and screen   Collection Time: 01/29/21  7:27 AM  Result Value Ref Range   ABO/RH(D) O POS    Antibody Screen NEG    Sample Expiration      02/01/2021,2359 Performed at [facility], [place] [address], [place], [place] [address]     Patient Active Problem List   Diagnosis Date Noted   GBS (group B Streptococcus carrier), +RV culture, currently pregnant 12/25/2020   Choroid plexus cyst of fetus in singleton pregnancy 09/08/2020   Pyelectasis of fetus on prenatal ultrasound 09/08/2020   Encounter for supervision of normal pregnancy in first trimester 07/07/2020    Assessment/Plan:  Kim Powell is a 30 y.o. G2P1001 at 52w2dhere for SOL  #Labor: Contracting spontaneously. Will assess at four  hours for need for augmentation #Pain: PRN, not planning epidural #FWB: Cat I #ID:  GBS +, PCN  #MOF: Breast #MOC: Undecided, will have a plan by 6 week follow-up  #Circ:  No  BPearla Dubonnet MD  01/29/2021, 8:38 AM  Midwife attestation: I have seen and examined this patient; I agree with above documentation in the resident's note.   PE: Gen: calm comfortable, NAD Resp: normal effort and rate Abd: gravid  ROS, labs, PMH reviewed  Assessment/Plan: [redacted]  weeks gestation Labor: early  FWB: Cat I GBS: pos Admit to LD PCN Expectant mngt Anticipate SVD  Julianne Handler, CNM  01/29/2021, 9:39 AM

## 2021-01-29 NOTE — Progress Notes (Signed)
Kim Powell is a 30 y.o. G2P1001 at 100w2d admitted for active labor  Subjective:   Objective: BP (!) 132/91   Pulse (!) 106   Temp 98.7 F (37.1 C) (Oral)   Resp 16   Ht 5\' 2"  (1.575 m)   Wt 84.8 kg   LMP 04/23/2020   BMI 34.20 kg/m  I/O last 3 completed shifts: In: 1477.1 [P.O.:600; I.V.:777.1; IV Piggyback:100] Out: -  No intake/output data recorded.  FHT:  FHR: 135 bpm, variability: moderate,  accelerations:  Present,  decelerations:  Present early with some contractions UC:   regular, every 3-4 minutes SVE:   Dilation: 6 Effacement (%): 80 Station: -2 Exam by:: [email or web address] Leftwich-Kirby Minimal fluid noted on pad, small forebag palpable, AROM but again with scant fluid  Labs: Lab Results  Component Value Date   WBC 16.2 (H) 01/29/2021   HGB 12.9 01/29/2021   HCT 37.8 01/29/2021   MCV 88.1 01/29/2021   PLT 332 01/29/2021    Assessment / Plan: Augmentation of labor, progressing well  Labor: Progressing normally. Discussed options, including IV Pitocin or continuing current course and pt does not desire Pitocin at this time. Contractions are more painful since initial AROM so will continue to change positions to encourage rotation/descent. Preeclampsia:   n/a Fetal Wellbeing:  Category I Pain Control:  Labor support without medications I/D:   GBS positive Anticipated MOD:  NSVD  01/31/2021 01/29/2021, 7:34 PM

## 2021-01-29 NOTE — Progress Notes (Signed)
Kim Powell is a 30 y.o. G2P1001 at [redacted]w[redacted]d by 11 week ultrasound admitted for active labor  Subjective:   Objective: BP 126/90   Pulse (!) 101   Temp 98.3 F (36.8 C) (Oral)   Resp 16   Ht 5\' 2"  (1.575 m)   Wt 84.8 kg   LMP 04/23/2020   BMI 34.20 kg/m  No intake/output data recorded. No intake/output data recorded.  FHT:  FHR: 135 bpm, variability: moderate,  accelerations:  Present,  decelerations:  Absent UC:   irregular, every 2-5 minutes SVE:   Dilation: 5 Effacement (%): 70 Station: -2 Exam by:: Osten Janek Leftwich-Kirby AROM, pink tinged scant fluid, pt tolerated well  Labs: Lab Results  Component Value Date   WBC 16.2 (H) 01/29/2021   HGB 12.9 01/29/2021   HCT 37.8 01/29/2021   MCV 88.1 01/29/2021   PLT 332 01/29/2021    Assessment / Plan: Spontaneous labor, progressing normally  Labor:  Likely early labor with change from 4-5 cm in 4 hours. Contractions with minimal change in intensity and with some spacing to 5-7 minutes apart at times.  Discussed options including expectant management, Pitocin, and AROM with pt. She selects AROM at this time.  Preeclampsia:   n/a Fetal Wellbeing:  Category I Pain Control:  Labor support without medications I/D:   GBS positive Anticipated MOD:  NSVD  01/31/2021 01/29/2021, 3:01 PM

## 2021-01-29 NOTE — Progress Notes (Signed)
Labor Progress Note Kim Powell is a 30 y.o. G2P1001 at [redacted]w[redacted]d presented for active labor.   S: Doing well, on exercise ball currently. Feeling contractions stronger and getting closer.  O:  BP 130/84   Pulse (!) 103   Temp 98.4 F (36.9 C) (Oral)   Resp 16   Ht 5\' 2"  (1.575 m)   Wt 84.8 kg   LMP 04/23/2020   BMI 34.20 kg/m  EFM: 135/mod/15x15/early  Contractions 1-8 minutes apart   CVE: Dilation: 6.5 Effacement (%): 80 Cervical Position: Posterior Station: -2 Presentation: Vertex Exam by:: [email or web address], RN   A&P: 30 y.o. G2P1001 [redacted]w[redacted]d  #Labor: Minimal progression since last check with expectant management, however patient endorses feeling closer and stronger contractions. Discussed options including starting IV pit, but patient adamantly would like to continue without if possible (poor experience with pit during her first delivery, addressed expectations of using pit for current labor). Encouraged continued walking, exercise ball, and varying positions for cervical change and descent. Currently a Cat 1 strip without any additional concern. Encouraged strongly considering pit on next check if no change.  #Pain: PRN  #FWB: Cat 1  #GBS positive PCN   #LGA: EFW 90% at [redacted]w[redacted]d U/S, 3245 gm. Pelvis tested to 3592 gm. Will prepare for shoulder dystocia.    [redacted]w[redacted]d, DO 11:40 PM

## 2021-01-29 NOTE — MAU Note (Signed)
Pt report she has had ctx on nadnoff since yesterday. They are closer now about q 3 min. Reports loosing her mucus plug but denies leaking. Stated she is feeling baby move but not as much as usual. 1cm last Monday. Induction was scheduled on Tuesday but was placed on hold and not called in yet.

## 2021-01-30 ENCOUNTER — Inpatient Hospital Stay (HOSPITAL_COMMUNITY): Payer: Medicaid Other | Admitting: Anesthesiology

## 2021-01-30 ENCOUNTER — Encounter (HOSPITAL_COMMUNITY): Payer: Self-pay | Admitting: Obstetrics & Gynecology

## 2021-01-30 ENCOUNTER — Other Ambulatory Visit: Payer: Self-pay | Admitting: Obstetrics and Gynecology

## 2021-01-30 DIAGNOSIS — Z3A41 41 weeks gestation of pregnancy: Secondary | ICD-10-CM

## 2021-01-30 DIAGNOSIS — O99820 Streptococcus B carrier state complicating pregnancy: Secondary | ICD-10-CM

## 2021-01-30 DIAGNOSIS — O480 Post-term pregnancy: Secondary | ICD-10-CM

## 2021-01-30 MED ORDER — DIPHENHYDRAMINE HCL 50 MG/ML IJ SOLN
12.5000 mg | INTRAMUSCULAR | Status: DC | PRN
Start: 2021-01-30 — End: 2021-01-30

## 2021-01-30 MED ORDER — LIDOCAINE HCL (PF) 1 % IJ SOLN
INTRAMUSCULAR | Status: DC | PRN
  Administered 2021-01-30 (×2): 4 mL via EPIDURAL

## 2021-01-30 MED ORDER — ACETAMINOPHEN 325 MG PO TABS: 650.0000 mg | ORAL_TABLET | ORAL | Status: DC | PRN

## 2021-01-30 MED ORDER — PHENYLEPHRINE 40 MCG/ML (10ML) SYRINGE FOR IV PUSH (FOR BLOOD PRESSURE SUPPORT): 80.0000 ug | PREFILLED_SYRINGE | INTRAVENOUS | Status: DC | PRN

## 2021-01-30 MED ORDER — EPHEDRINE 5 MG/ML INJ: 10.0000 mg | INTRAVENOUS | Status: DC | PRN

## 2021-01-30 MED ORDER — IBUPROFEN 600 MG PO TABS
600.0000 mg | ORAL_TABLET | Freq: Four times a day (QID) | ORAL | Status: DC
  Administered 2021-01-30 – 2021-01-31 (×4): 600 mg via ORAL
  Filled 2021-01-30 (×4): qty 1

## 2021-01-30 MED ORDER — FENTANYL-BUPIVACAINE-NACL 0.5-0.125-0.9 MG/250ML-% EP SOLN
12.0000 mL/h | EPIDURAL | Status: DC | PRN
  Administered 2021-01-30: 12 mL/h via EPIDURAL
  Filled 2021-01-30: qty 250

## 2021-01-30 MED ORDER — LACTATED RINGERS IV SOLN
500.0000 mL | Freq: Once | INTRAVENOUS | Status: AC
  Administered 2021-01-30: 500 mL via INTRAVENOUS

## 2021-01-30 MED ORDER — ZOLPIDEM TARTRATE 5 MG PO TABS: 5.0000 mg | ORAL_TABLET | Freq: Every evening | ORAL | Status: DC | PRN

## 2021-01-30 MED ORDER — SIMETHICONE 80 MG PO CHEW
80.0000 mg | CHEWABLE_TABLET | ORAL | Status: DC | PRN
Start: 2021-01-30 — End: 2021-01-31

## 2021-01-30 MED ORDER — PHENYLEPHRINE 40 MCG/ML (10ML) SYRINGE FOR IV PUSH (FOR BLOOD PRESSURE SUPPORT)
80.0000 ug | PREFILLED_SYRINGE | INTRAVENOUS | Status: DC | PRN
Start: 2021-01-30 — End: 2021-01-30

## 2021-01-30 MED ORDER — LACTATED RINGERS IV SOLN: 500.0000 mL | Freq: Once | INTRAVENOUS | Status: DC

## 2021-01-30 MED ORDER — COCONUT OIL OIL: 1.0000 "application " | TOPICAL_OIL | Status: DC | PRN

## 2021-01-30 MED ORDER — OXYTOCIN-SODIUM CHLORIDE 30-0.9 UT/500ML-% IV SOLN
1.0000 m[IU]/min | INTRAVENOUS | Status: DC
  Administered 2021-01-30: 2 m[IU]/min via INTRAVENOUS

## 2021-01-30 MED ORDER — BENZOCAINE-MENTHOL 20-0.5 % EX AERO
1.0000 "application " | INHALATION_SPRAY | CUTANEOUS | Status: DC | PRN
  Administered 2021-01-31: 1 via TOPICAL
  Filled 2021-01-30 (×2): qty 56

## 2021-01-30 MED ORDER — TETANUS-DIPHTH-ACELL PERTUSSIS 5-2.5-18.5 LF-MCG/0.5 IM SUSY
0.5000 mL | PREFILLED_SYRINGE | Freq: Once | INTRAMUSCULAR | Status: DC
Start: 2021-01-31 — End: 2021-01-31

## 2021-01-30 MED ORDER — SENNOSIDES-DOCUSATE SODIUM 8.6-50 MG PO TABS
2.0000 | ORAL_TABLET | Freq: Every day | ORAL | Status: DC
Start: 2021-01-31 — End: 2021-01-31

## 2021-01-30 MED ORDER — PRENATAL MULTIVITAMIN CH
1.0000 | ORAL_TABLET | Freq: Every day | ORAL | Status: DC
Start: 2021-01-30 — End: 2021-01-31
  Administered 2021-01-31: 1 via ORAL
  Filled 2021-01-30 (×2): qty 1

## 2021-01-30 MED ORDER — ALBUTEROL SULFATE (2.5 MG/3ML) 0.083% IN NEBU: 2.5000 mg | INHALATION_SOLUTION | Freq: Four times a day (QID) | RESPIRATORY_TRACT | Status: DC | PRN

## 2021-01-30 MED ORDER — ONDANSETRON HCL 4 MG PO TABS: 4.0000 mg | ORAL_TABLET | ORAL | Status: DC | PRN

## 2021-01-30 MED ORDER — DIBUCAINE (PERIANAL) 1 % EX OINT: 1.0000 "application " | TOPICAL_OINTMENT | CUTANEOUS | Status: DC | PRN

## 2021-01-30 MED ORDER — DIPHENHYDRAMINE HCL 25 MG PO CAPS
25.0000 mg | ORAL_CAPSULE | Freq: Four times a day (QID) | ORAL | Status: DC | PRN
Start: 2021-01-30 — End: 2021-01-31

## 2021-01-30 MED ORDER — WITCH HAZEL-GLYCERIN EX PADS: 1.0000 "application " | MEDICATED_PAD | CUTANEOUS | Status: DC | PRN

## 2021-01-30 MED ORDER — ONDANSETRON HCL 4 MG/2ML IJ SOLN: 4.0000 mg | INTRAMUSCULAR | Status: DC | PRN

## 2021-01-30 NOTE — Anesthesia Preprocedure Evaluation (Signed)
Anesthesia Evaluation  Patient identified by MRN, date of birth, ID band Patient awake    Reviewed: Allergy & Precautions, H&P , NPO status , Patient's Chart, lab work & pertinent test results  Airway Mallampati: II   Neck ROM: full    Dental   Pulmonary asthma ,    breath sounds clear to auscultation       Cardiovascular negative cardio ROS   Rhythm:regular Rate:Normal     Neuro/Psych    GI/Hepatic   Endo/Other    Renal/GU      Musculoskeletal   Abdominal   Peds  Hematology   Anesthesia Other Findings   Reproductive/Obstetrics (+) Pregnancy                             Anesthesia Physical Anesthesia Plan  ASA: 2  Anesthesia Plan: Epidural   Post-op Pain Management:    Induction: Intravenous  PONV Risk Score and Plan: 2 and Treatment may vary due to age or medical condition  Airway Management Planned: Natural Airway  Additional Equipment:   Intra-op Plan:   Post-operative Plan:   Informed Consent: I have reviewed the patients History and Physical, chart, labs and discussed the procedure including the risks, benefits and alternatives for the proposed anesthesia with the patient or authorized representative who has indicated his/her understanding and acceptance.     Dental advisory given  Plan Discussed with: Anesthesiologist  Anesthesia Plan Comments:         Anesthesia Quick Evaluation  

## 2021-01-30 NOTE — Progress Notes (Signed)
Labor Progress Note Kim Powell is a 30 y.o. G2P1001 at [redacted]w[redacted]d presented for active labor  S: had epidural placed, feeling more comfortable. Called by RN for [facility] concern, unable to tell if early or lates, requesting internal monitoring.   O:  BP 109/64   Pulse 89   Temp 98.3 F (36.8 C) (Oral)   Resp 18   Ht 5\' 2"  (1.575 m)   Wt 84.8 kg   LMP 04/23/2020   BMI 34.20 kg/m  EFM: 120-130/mod var/15x15/likely early decels with occasional variable   CVE: Dilation: 7 Effacement (%): 80, 90 Cervical Position: Posterior Station: -1 Presentation: Vertex Exam by:: [email or web address], RN   A&P: 30 y.o. G2P1001 [redacted]w[redacted]d  #Labor: Some but minimal progression, fetal head still feels malpositioned. Placed IUPC to further assess contractions and FHT pattern, still with moderate variability in between. Will hold off on FSE per patient preference, tracing well externally. Already s/p bolus and position changes, will cont to switch positions as needed.  Overall appears like majority are early decelerations for now, will keep pit at 4. If change in pattern, will d/c. Dr. [redacted]w[redacted]d made aware of FHT.  #Pain: Epidural placed  #FWB: Cat II strip, see above  #GBS positive PCN   Alysia Penna, DO 7:05 AM

## 2021-01-30 NOTE — Anesthesia Procedure Notes (Signed)
Epidural Patient location during procedure: OB Start time: 01/30/2021 3:28 AM End time: 01/30/2021 3:38 AM  Staffing Anesthesiologist: Achille Rich, MD Performed: anesthesiologist   Preanesthetic Checklist Completed: patient identified, IV checked, site marked, risks and benefits discussed, monitors and equipment checked, pre-op evaluation and timeout performed  Epidural Patient position: sitting Prep: DuraPrep Patient monitoring: heart rate, cardiac monitor, continuous pulse ox and blood pressure Approach: midline Location: L2-L3 Injection technique: LOR saline  Needle:  Needle type: Tuohy  Needle gauge: 17 G Needle length: 9 cm Needle insertion depth: 5 cm Catheter type: closed end flexible Catheter size: 19 Gauge Catheter at skin depth: 11 cm Test dose: negative and Other  Assessment Events: blood not aspirated, injection not painful, no injection resistance and negative IV test  Additional Notes Informed consent obtained prior to proceeding including risk of failure, 1% risk of PDPH, risk of minor discomfort and bruising.  Discussed rare but serious complications including epidural abscess, permanent nerve injury, epidural hematoma.  Discussed alternatives to epidural analgesia and patient desires to proceed.  Timeout performed pre-procedure verifying patient name, procedure, and platelet count.  Patient tolerated procedure well. Reason for block:procedure for pain

## 2021-01-30 NOTE — Discharge Summary (Signed)
Postpartum Discharge Summary  Date of Service updated 01/31/21     Patient Name: Kim Powell DOB: 05/05/1991 MRN: [id number]  Date of admission: 01/29/2021 Delivery date:01/30/2021  Delivering provider: Laury Deep  Date of discharge: 02/11/2021  Admitting diagnosis: Post term pregnancy at [redacted] weeks gestation [O48.0, Z3A.41] Intrauterine pregnancy: [redacted]w[redacted]d    Secondary diagnosis:  Active Problems:   * No active hospital problems. *  Additional problems: meconium stained fluid    Discharge diagnosis: Term Pregnancy Delivered                                              Post partum procedures: none Augmentation: AROM and Pitocin Complications: None  Hospital course: Onset of Labor With Vaginal Delivery      30y.o. yo G2P1001 at 454w3das admitted in Active Labor on 01/29/2021. Patient had an uncomplicated labor course as follows:  Membrane Rupture Time/Date: 2:05 PM ,01/29/2021   Delivery Method:Vaginal, Spontaneous  Episiotomy: None  Lacerations:  None  Patient had an uncomplicated postpartum course.  She is ambulating, tolerating a regular diet, passing flatus, and urinating well. Patient is discharged home in stable condition on 02/11/21.  Newborn Data: Birth date:01/30/2021  Birth time:8:54 AM  Gender:Female  Living status:Living  Apgars:8 ,9  Weight:3.487 kg   Magnesium Sulfate received: No BMZ received: No Rhophylac:N/A MMR:N/A T-DaP: not done Flu: No Transfusion:No  Physical exam  Vitals:   01/30/21 1630 01/30/21 2030 01/31/21 0040 01/31/21 0606  BP: 118/72 123/82 119/82 113/76  Pulse: 89 87 84 82  Resp: _0 Temp: 98.4 F (36.9 C) 98 F (36.7 C) 98.2 F (36.8 C) 97.7 F (36.5 C)  TempSrc: Oral Oral Oral Oral  SpO2:  100%  100%  Weight:      Height:       General: alert, cooperative, and no distress Lochia: appropriate Uterine Fundus: firm Incision: N/A DVT Evaluation: No evidence of DVT seen on physical exam. Labs: Lab Results   Component Value Date   WBC 16.2 (H) 01/29/2021   HGB 12.9 01/29/2021   HCT 37.8 01/29/2021   MCV 88.1 01/29/2021   PLT 332 01/29/2021   CMP Latest Ref Rng & Units 01/29/2021  Glucose 70 - 99 mg/dL 97  BUN 6 - 20 mg/dL 7  Creatinine 0.44 - 1.00 mg/dL 0.63  Sodium 135 - 145 mmol/L 134(L)  Potassium 3.5 - 5.1 mmol/L 3.9  Chloride 98 - 111 mmol/L 108  CO2 22 - 32 mmol/L 17(L)  Calcium 8.9 - 10.3 mg/dL 8.8(L)  Total Protein 6.5 - 8.1 g/dL 6.9  Total Bilirubin 0.3 - 1.2 mg/dL 0.4  Alkaline Phos 38 - 126 U/L 159(H)  AST 15 - 41 U/L 23  ALT 0 - 44 U/L 15   Edinburgh Score: Edinburgh Postnatal Depression Scale Screening Tool 01/30/2021  I have been able to laugh and see the funny side of things. 0  I have looked forward with enjoyment to things. 0  I have blamed myself unnecessarily when things went wrong. 0  I have been anxious or worried for no good reason. 0  I have felt scared or panicky for no good reason. 0  Things have been getting on top of me. 0  I have been so unhappy that I have had difficulty sleeping. 0  I have felt sad or miserable.  0  I have been so unhappy that I have been crying. 0  The thought of harming myself has occurred to me. 0  Edinburgh Postnatal Depression Scale Total 0     After visit meds:  Allergies as of 01/31/2021   No Known Allergies      Medication List     STOP taking these medications    acetaminophen 500 MG tablet Commonly known as: TYLENOL   albuterol 108 (90 Base) MCG/ACT inhaler Commonly known as: VENTOLIN HFA   Blood Pressure Kit Devi   Doxylamine-Pyridoxine 10-10 MG Tbec Commonly known as: Diclegis   lidocaine 2 % solution Commonly known as: XYLOCAINE   triamcinolone ointment 0.5 % Commonly known as: KENALOG       TAKE these medications    ibuprofen 600 MG tablet Commonly known as: ADVIL Take 1 tablet (600 mg total) by mouth every 6 (six) hours.   prenatal multivitamin Tabs tablet Take 1 tablet by mouth daily  at 12 noon.         Discharge home in stable condition Infant Feeding: Breast Infant Disposition:home with mother Discharge instruction: per After Visit Summary and Postpartum booklet. Activity: Advance as tolerated. Pelvic rest for 6 weeks.  Diet: routine diet Future Appointments: Future Appointments  Date Time Provider [place]  03/23/2021  3:10 PM Leftwich-Kirby, Kathie Dike, CNM [facility] None   Follow up Visit:  Message sent to [facility] by R. Renato Battles, CNM on 01/30/2021: Please schedule this patient for a In person postpartum visit in 4 weeks with the following provider: Any provider. Additional Postpartum F/U: none   Low risk pregnancy complicated by:  none Delivery mode:  Vaginal, Spontaneous  Anticipated Birth Control:  Unsure   01/31/21 Fatima Blank, CNM

## 2021-01-30 NOTE — Progress Notes (Signed)
Labor Progress Note Kim Powell is a 30 y.o. G2P1001 at [redacted]w[redacted]d presented for active labor.   S: Feeling contractions stronger. Considering epidural.   O:  BP 139/90   Pulse 99   Temp 98.1 F (36.7 C) (Oral)   Resp 16   Ht 5\' 2"  (1.575 m)   Wt 84.8 kg   LMP 04/23/2020   BMI 34.20 kg/m  EFM: 130/mod var/15x15, occasional variable and 1-2 shallow late decels   CVE: Dilation: 6.5 Effacement (%): 80 Cervical Position: Posterior Station: -2 Presentation: Vertex Exam by:: Dr. [email or web address]   A&P: 30 y.o. G2P1001 [redacted]w[redacted]d  #Labor: Minimal change since last visit, fetal head does feel asynclitic in pelvis. Recent shallow late with occasional variables, but overall recovers well with good variability. S/p some position changes and IVF bolus. Discussed options with patient, she would like to proceed with epidural and then pit if strip improved. Low threshold to place IUPC/FSE, however patient would like to postpone for now, which is reasonable as FHT is reassuring currently while sitting up. Cont position changes as needed for FHT and suspected malpositioning.  #Pain: Epidural planned  #FWB: Cat II, see above  #GBS positive PCN    [redacted]w[redacted]d, DO 2:55 AM

## 2021-01-30 NOTE — Anesthesia Postprocedure Evaluation (Signed)
Anesthesia Post Note  Patient: Kim Powell  Procedure(s) Performed: AN AD HOC LABOR EPIDURAL     Patient location during evaluation: Mother Baby Anesthesia Type: Epidural Level of consciousness: awake and alert Pain management: pain level controlled Vital Signs Assessment: post-procedure vital signs reviewed and stable Respiratory status: spontaneous breathing, nonlabored ventilation and respiratory function stable Cardiovascular status: stable Postop Assessment: no headache, no backache and epidural receding Anesthetic complications: no   No notable events documented.  Last Vitals:  Vitals:   01/30/21 1150 01/30/21 1630  BP: 117/74 118/72  Pulse: 92 89  Resp: 16 16  Temp: 36.8 C 36.9 C    Last Pain:  Vitals:   01/30/21 1630  TempSrc: Oral  PainSc: 6    Pain Goal: Patients Stated Pain Goal: Other (Comment) (pt accepting pain, pain meds offered and pt denies at this time) (01/29/21 1206)                 Rica Records

## 2021-01-31 MED ORDER — IBUPROFEN 600 MG PO TABS: 600.0000 mg | ORAL_TABLET | Freq: Four times a day (QID) | ORAL | 0 refills | Status: DC

## 2021-01-31 NOTE — Plan of Care (Signed)
Discharge instructions given, pt receptive.  

## 2021-02-11 ENCOUNTER — Telehealth (HOSPITAL_COMMUNITY): Payer: Self-pay | Admitting: *Deleted

## 2021-02-11 NOTE — Telephone Encounter (Signed)
Attempted Hospital Discharge Follow-Up Call.  Left voice mail requesting that patient return RN's phone call.  

## 2021-03-03 ENCOUNTER — Other Ambulatory Visit: Payer: Self-pay | Admitting: Advanced Practice Midwife

## 2021-03-03 DIAGNOSIS — B3789 Other sites of candidiasis: Secondary | ICD-10-CM

## 2021-03-03 MED ORDER — FLUCONAZOLE 150 MG PO TABS: 150.0000 mg | ORAL_TABLET | Freq: Once | ORAL | 0 refills | Status: AC

## 2021-03-03 NOTE — Progress Notes (Signed)
Pt reports infant has thrush and she has new onset painful latch with breastfeeding.  Diflucan 150 mg x 1 dose sent to pharmacy.  Pt to f/u in office and/or with lactation if symptoms not improved.

## 2021-03-09 IMAGING — US US OB COMP LESS 14 WK
1 series · 14 of 28 positions shown · non-contrast
Comparison: none

[Series 1: us ob comp less 14 wk · 49 acquisitions, 14 frames shown]
[im 2/49]
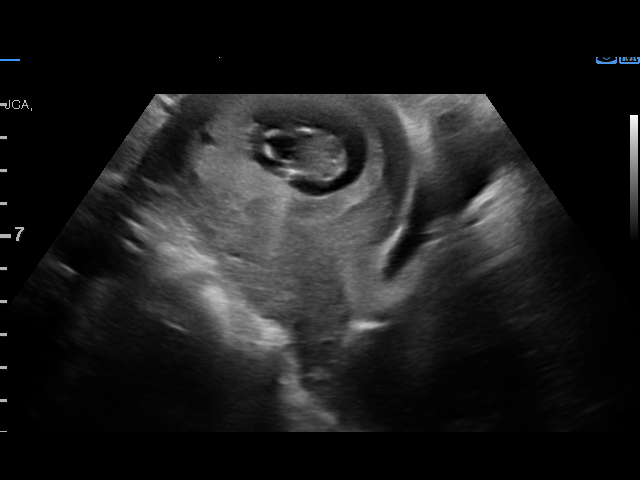
[im 6/49]
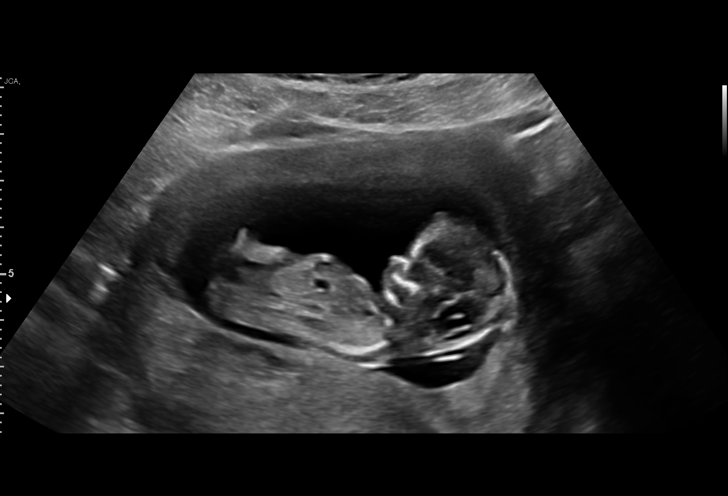
[im 9/49]
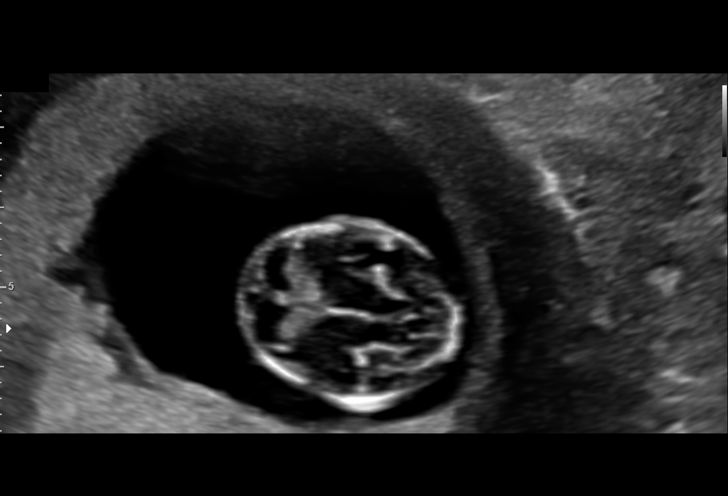
[im 13/49]
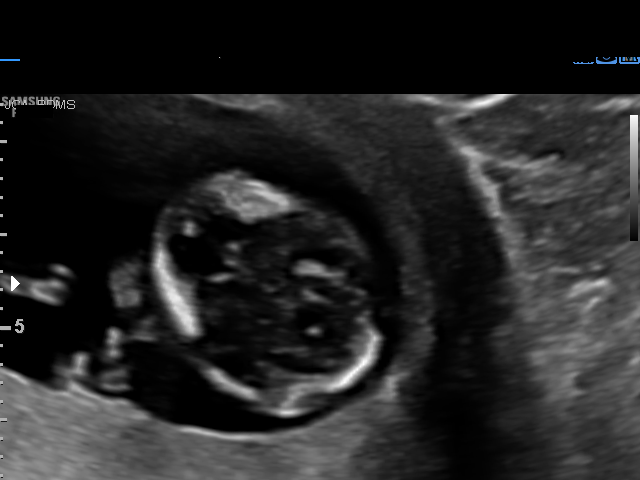
[im 17/49]
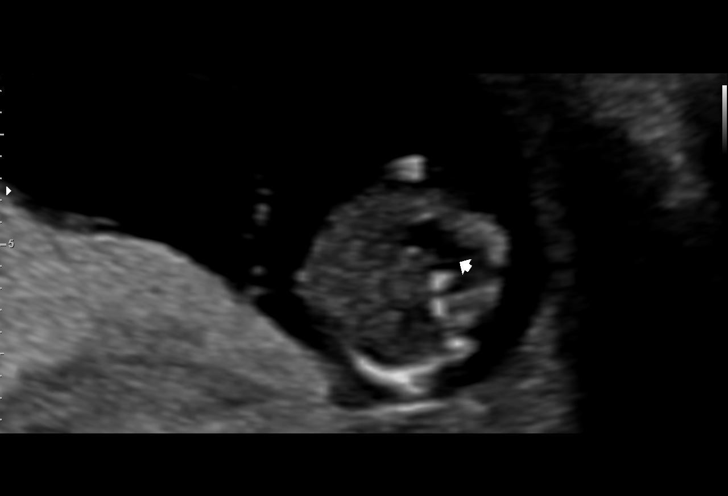
[im 20/49]
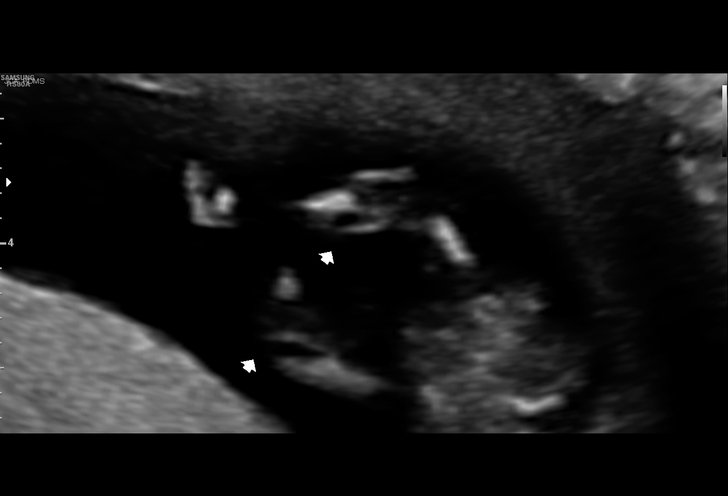
[im 24/49]
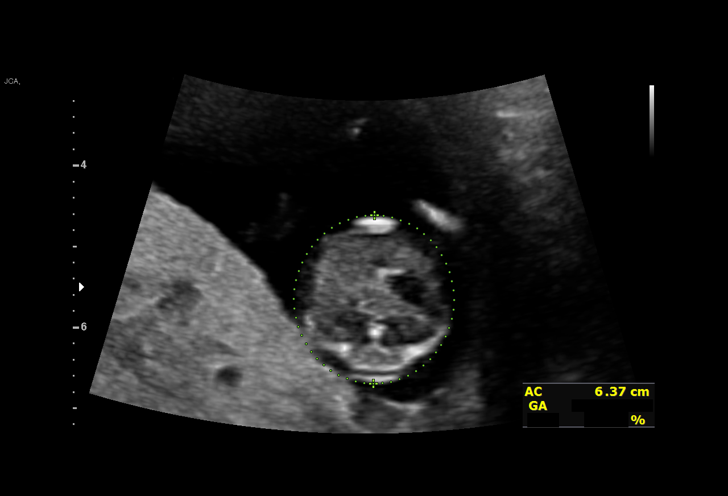
[im 27/49]
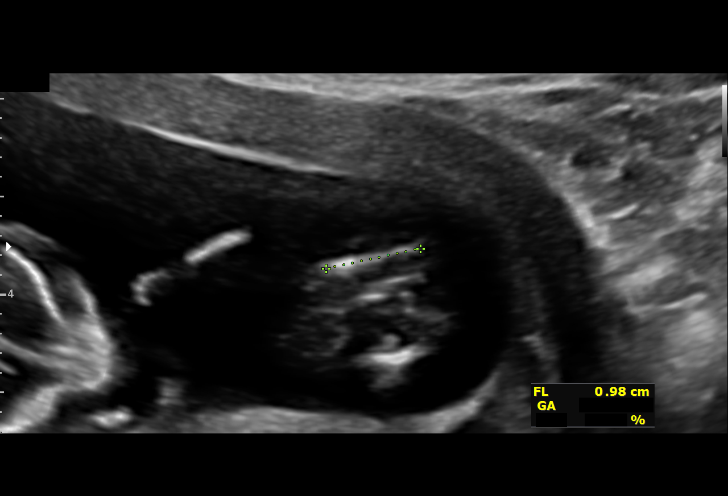
[im 31/49]
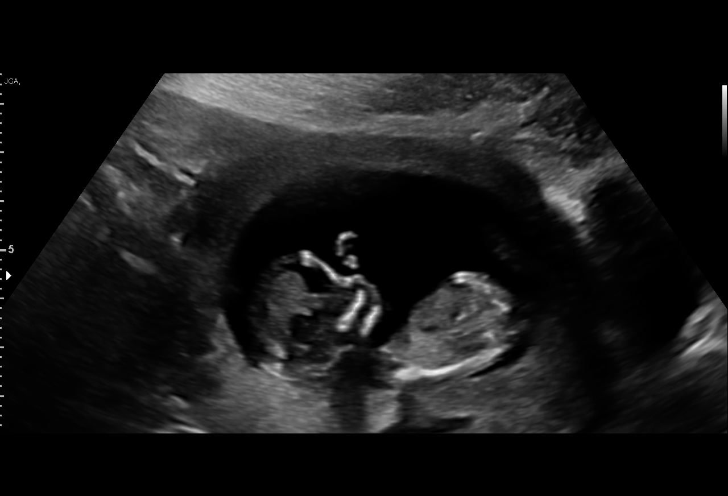
[im 34/49]
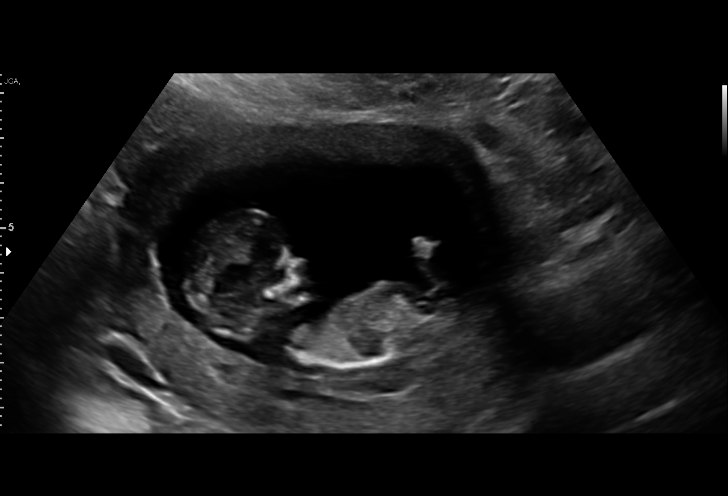
[im 38/49]
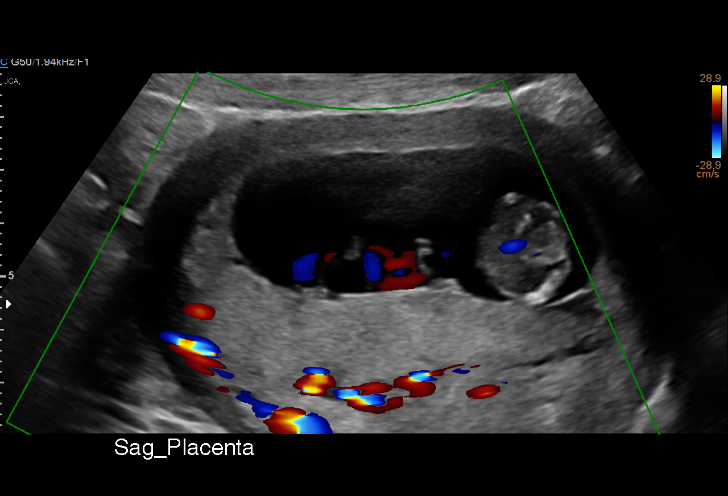
[im 41/49]
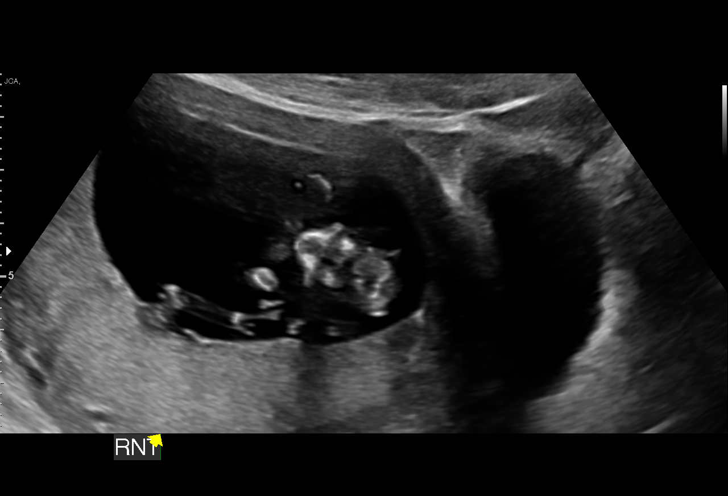
[im 45/49]
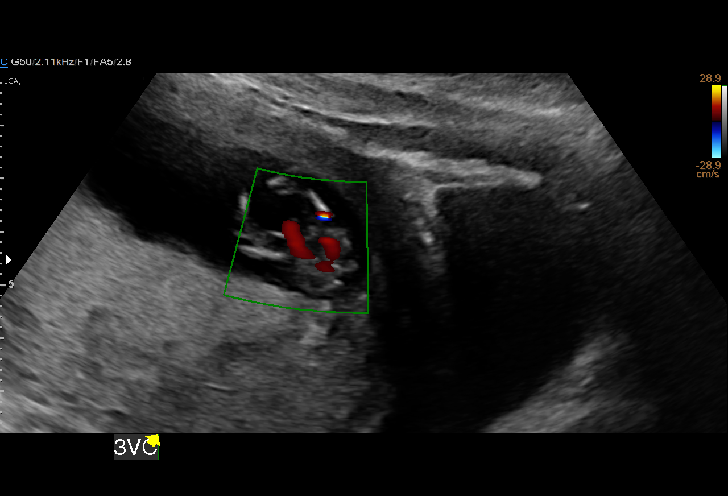
[im 49/49]
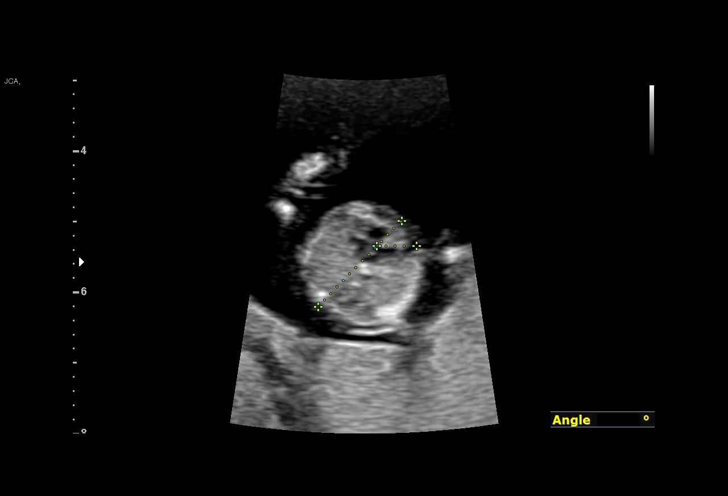

[14 of 28 positions shown; findings below may reference images not displayed]

[REDACTED]care

    ANATOMY                                           CHATEL

Indications

 Abnormal fetal ultrasound (enlarged fetal
 bladder)
 Obesity complicating pregnancy, first
 trimester (BMI 31)
 12 weeks gestation of pregnancy
 Encounter for antenatal screening for
 malformations
Fetal Evaluation

 Num Of Fetuses:         1
 Fetal Heart Rate(bpm):  160
 Cardiac Activity:       Observed
 Presentation:           Variable
 Placenta:               Posterior
 P. Cord Insertion:      Visualized

 Amniotic Fluid
 AFI FV:      Within normal limits

                             Largest Pocket(cm)

Biometry

 CRL:      66.5  mm     G. Age:  12w 6d                  EDD:   01/20/21
OB History
 Gravidity:    2         Term:   1
 Living:       1
Gestational Age

 Best:          12w 6d     Det. By:  U/S C R L  (07/07/20)    EDD:   01/20/21
Anatomy

 Cranium:               Visualized             Stomach:                Visualized
 Ventricles:            Appears normal         Abdominal Wall:         Appears nml (cord
                                                                       insert, abd wall)
 Choroid Plexus:        Visualized             Cord Vessels:           Appears normal (3
                                                                       vessel cord)
 Face:                  Visualized             Bladder:                Appears normal
 Thoracic:              Visualized             Upper Extremities:      Visualized
 Heart:                 Appears normal         Lower Extremities:      Visualized
                        (4CH, axis, and
                        situs)
Impression

 Patient is here for first trimester anatomy scan.  On your
 office ultrasound performed last week, enlarged fetal bladder
 was seen.  Patient has not had screening for fetal
 aneuploidies.

 Obstetric history significant for a term vaginal delivery in 2660
 of a male infant.  Her pregnancy and delivery were
 uncomplicated.

 On ultrasound, the CRL measurement is consistent with her
 previously-established dates and good fetal heart activity is
 seen. The nuchal translucency (NT)could not be measured
 because of fetal position but there is no evidence of cystic
 hygroma. Fetal bladder appears normal. Fetal anatomy that
 could be ascertained at this gestational age is normal.
 I reassured the patient of the findings.

 Patient is undecided on screening for fetal aneuploidies.
Recommendations

 -An appointment was made for her to return in 6 weeks for
 fetal anatomy scan.
                 Tiger, Alia

## 2021-03-11 ENCOUNTER — Ambulatory Visit: Payer: Medicaid Other | Admitting: Women's Health

## 2021-03-23 ENCOUNTER — Other Ambulatory Visit: Payer: Self-pay

## 2021-03-23 ENCOUNTER — Ambulatory Visit (INDEPENDENT_AMBULATORY_CARE_PROVIDER_SITE_OTHER): Payer: Medicaid Other | Admitting: Advanced Practice Midwife

## 2021-03-23 ENCOUNTER — Encounter: Payer: Self-pay | Admitting: Advanced Practice Midwife

## 2021-03-23 DIAGNOSIS — Z3009 Encounter for other general counseling and advice on contraception: Secondary | ICD-10-CM | POA: Diagnosis not present

## 2021-03-23 DIAGNOSIS — B3789 Other sites of candidiasis: Secondary | ICD-10-CM | POA: Diagnosis not present

## 2021-03-23 NOTE — Progress Notes (Signed)
Post Partum Visit Note  Kim Powell is a 30 y.o. G35P2002 female who presents for a postpartum visit. She is 6 weeks postpartum following a normal spontaneous vaginal delivery.  I have fully reviewed the prenatal and intrapartum course. The delivery was at 41 gestational weeks.  Anesthesia: epidural. Postpartum course has been uncomplicated. Baby is doing well. Baby is feeding by breast. Bleeding no bleeding. Bowel function is normal. Bladder function is normal. Patient is not sexually active. Contraception method is none. Postpartum depression screening: negative.   The pregnancy intention screening data noted above was reviewed. Potential methods of contraception were discussed. The patient elected to proceed with No data recorded.   Edinburgh Postnatal Depression Scale - 03/23/21 1518       Edinburgh Postnatal Depression Scale:  In the Past 7 Days   I have been able to laugh and see the funny side of things. 0    I have looked forward with enjoyment to things. 0    I have blamed myself unnecessarily when things went wrong. 0    I have been anxious or worried for no good reason. 0    I have felt scared or panicky for no good reason. 0    Things have been getting on top of me. 0    I have been so unhappy that I have had difficulty sleeping. 0    I have felt sad or miserable. 0    I have been so unhappy that I have been crying. 0    The thought of harming myself has occurred to me. 0    Edinburgh Postnatal Depression Scale Total 0             Health Maintenance Due  Topic Date Due   COVID-19 Vaccine (1) Never done   TETANUS/TDAP  Never done   INFLUENZA VACCINE  Never done    The following portions of the patient's history were reviewed and updated as appropriate: allergies, current medications, past family history, past medical history, past social history, past surgical history, and problem list.  Review of Systems Pertinent items noted in HPI and remainder of  comprehensive ROS otherwise negative.  Objective:  BP 122/82   Pulse 82   Ht 5\' 3"  (1.6 m)   Wt 170 lb 9.6 oz (77.4 kg)   Breastfeeding Yes   BMI 30.22 kg/m    Assessment:   1. Postpartum care following vaginal delivery --Doing well, bonding well with infant, good support at home.  2. Candidiasis of breast --Tx 1 month ago, no symptoms now  3. Encounter for counseling regarding contraception --Discussed pt contraceptive plans and reviewed contraceptive methods based on pt preferences and effectiveness.  Pt prefers to continue lactational amenorrhea for now, natural family planning when menses resume.  Would welcome another pregnancy if it occurred.    normal postpartum exam.   Plan:   Essential components of care per ACOG recommendations:  1.  Mood and well being: Patient with negative depression screening today. Reviewed local resources for support.  - Patient tobacco use? No.   - hx of drug use? No.    2. Infant care and feeding:  -Patient currently breastmilk feeding? Yes. Reviewed importance of draining breast regularly to support lactation.  -Social determinants of health (SDOH) reviewed in EPIC. No concerns  3. Sexuality, contraception and birth spacing - Patient does not want a pregnancy in the next year.   - Reviewed forms of contraception with pt preferences and effectiveness. Patient  desired  lactational amenorrhea  today.   - Discussed birth spacing of 18 months  4. Sleep and fatigue -Encouraged family/partner/community support of 4 hrs of uninterrupted sleep to help with mood and fatigue  5. Physical Recovery  - Discussed patients delivery and complications. She describes her labor as good. - Patient had a Vaginal, no problems at delivery. Patient had no laceration. Perineal healing reviewed. Patient expressed understanding - Patient has urinary incontinence? No. - Patient is safe to resume physical and sexual activity  6.  Health Maintenance - HM due  items addressed Yes - Last pap smear  Diagnosis  Date Value Ref Range Status  07/14/2020   Final   - Negative for intraepithelial lesion or malignancy (NILM)   Pap smear not done at today's visit.  -Breast Cancer screening indicated? No.   7. Chronic Disease/Pregnancy Condition follow up: None  - PCP follow up  Sharen Counter, CNM [facility] [employer]

## 2021-03-23 NOTE — Progress Notes (Signed)
Patient presents for a postpartum visit. Patient has no concerns today. Declined flu vaccine.

## 2024-05-01 ENCOUNTER — Ambulatory Visit

## 2024-05-01 VITALS — BP 135/85 | HR 111 | Wt 181.7 lb

## 2024-05-01 DIAGNOSIS — Z3201 Encounter for pregnancy test, result positive: Secondary | ICD-10-CM | POA: Diagnosis not present

## 2024-05-01 DIAGNOSIS — Z3200 Encounter for pregnancy test, result unknown: Secondary | ICD-10-CM

## 2024-05-01 LAB — POCT URINE PREGNANCY: Preg Test, Ur: POSITIVE — AB

## 2024-05-01 NOTE — Progress Notes (Signed)
..  Ms. Koloski presents today for UPT. She has no unusual complaints. LMP: 9/25/2- irregular cycles    OBJECTIVE: Appears well, in no apparent distress.  OB History     Gravida  3   Para  2   Term  2   Preterm      AB      Living  2      SAB      IAB      Ectopic      Multiple  0   Live Births  2          Home UPT Result: Positive In-Office UPT result: Positive I have reviewed the patient's medical, obstetrical, social, and family histories, and medications.   ASSESSMENT: Positive pregnancy test  PLAN Prenatal care to be completed at: [facility] Provided safe med list Pt is already taking PNVs

## 2024-05-08 ENCOUNTER — Other Ambulatory Visit: Payer: Self-pay

## 2024-05-08 ENCOUNTER — Ambulatory Visit: Admitting: *Deleted

## 2024-05-08 VITALS — BP 128/83 | HR 116 | Wt 185.7 lb

## 2024-05-08 DIAGNOSIS — Z3481 Encounter for supervision of other normal pregnancy, first trimester: Secondary | ICD-10-CM | POA: Diagnosis not present

## 2024-05-08 DIAGNOSIS — O3680X0 Pregnancy with inconclusive fetal viability, not applicable or unspecified: Secondary | ICD-10-CM

## 2024-05-08 DIAGNOSIS — Z3480 Encounter for supervision of other normal pregnancy, unspecified trimester: Secondary | ICD-10-CM | POA: Insufficient documentation

## 2024-05-08 DIAGNOSIS — Z3A12 12 weeks gestation of pregnancy: Secondary | ICD-10-CM | POA: Diagnosis not present

## 2024-05-08 MED ORDER — BLOOD PRESSURE KIT DEVI: 1.0000 | 0 refills | Status: AC

## 2024-05-08 NOTE — Patient Instructions (Signed)
[facility] has a partnership with the [employer] to provide prenatal navigation for the most needed resources in our community. In order to see how we can help connect you to these resources we need consent to contact you. Please complete the very short consent using the link below:   English Link: [email or web address]  Spanish Link: [email or web address]?[email or web address]  

## 2024-05-08 NOTE — Progress Notes (Signed)
 New OB Intake  I connected with Kim Powell  on 05/08/24 at  1:10 PM EST by In Person Visit and verified that I am speaking with the correct person using two identifiers. Nurse is located at [facility] and pt is located at [place].  I discussed the limitations, risks, security and privacy concerns of performing an evaluation and management service by telephone and the availability of in person appointments. I also discussed with the patient that there may be a patient responsible charge related to this service. The patient expressed understanding and agreed to proceed.  I explained I am completing New OB Intake today. We discussed EDD of 11/18/24 based on US  at [redacted]w[redacted]d weeks. Pt is G3P2002. I reviewed her allergies, medications and Medical/Surgical/OB history.    There are no active problems to display for this patient.    Concerns addressed today  Delivery Plans Plans to deliver at [facility]. Discussed the nature of our practice with multiple providers including residents and students as well as female and female providers. Due to the size of the practice, the delivering provider may not be the same as those providing prenatal care.   Patient is interested in water birth.  MyChart/Babyscripts MyChart access verified. I explained pt will have some visits in office and some virtually. Babyscripts instructions given and order placed. Patient verifies receipt of registration text/e-mail. Account successfully created and app downloaded. If patient is a candidate for Optimized scheduling, add to sticky note.   Blood Pressure Cuff/Weight Scale Blood pressure cuff ordered for patient to pick-up from [employer]. Explained after first prenatal appt pt will check weekly and document in Babyscripts. Patient does not have weight scale; patient may purchase if they desire to track weight weekly in Babyscripts.  Anatomy US  Explained first scheduled US  will be around 19 weeks. Anatomy US  scheduled for TBD  at TBD.  Is patient a candidate for Babyscripts Optimization? No, due to Risk Factors   First visit review I reviewed new OB appt with patient. Explained pt will be seen by Dr. Abigail at first visit. Discussed Kim Powell genetic screening with patient. Requested Panorama and Horizon.. Routine prenatal labs collected at today's visit.   Last Pap Diagnosis  Date Value Ref Range Status  07/14/2020   Final   - Negative for intraepithelial lesion or malignancy (NILM)    Rocky Kim Ober, RN 05/08/2024  1:13 PM

## 2024-05-09 LAB — CBC/D/PLT+RPR+RH+ABO+RUBIGG...
Antibody Screen: NEGATIVE
Basophils Absolute: 0.1 x10E3/uL (ref 0.0–0.2)
Basos: 1 %
EOS (ABSOLUTE): 0.1 x10E3/uL (ref 0.0–0.4)
Eos: 1 %
HCV Ab: NONREACTIVE
HIV Screen 4th Generation wRfx: NONREACTIVE
Hematocrit: 41 % (ref 34.0–46.6)
Hemoglobin: 14 g/dL (ref 11.1–15.9)
Hepatitis B Surface Ag: NEGATIVE
Immature Grans (Abs): 0 x10E3/uL (ref 0.0–0.1)
Immature Granulocytes: 0 %
Lymphocytes Absolute: 1.3 x10E3/uL (ref 0.7–3.1)
Lymphs: 15 %
MCH: 30.1 pg (ref 26.6–33.0)
MCHC: 34.1 g/dL (ref 31.5–35.7)
MCV: 88 fL (ref 79–97)
Monocytes Absolute: 0.6 x10E3/uL (ref 0.1–0.9)
Monocytes: 7 %
Neutrophils Absolute: 7 x10E3/uL (ref 1.4–7.0)
Neutrophils: 76 %
Platelets: 425 x10E3/uL (ref 150–450)
RBC: 4.65 x10E6/uL (ref 3.77–5.28)
RDW: 12.6 % (ref 11.7–15.4)
RPR Ser Ql: NONREACTIVE
Rh Factor: POSITIVE
Rubella Antibodies, IGG: 2.59 {index} (ref >0.99)
WBC: 9.2 x10E3/uL (ref 3.4–10.8)

## 2024-05-09 LAB — HEMOGLOBIN A1C
Est. average glucose Bld gHb Est-mCnc: 100 mg/dL
Hgb A1c MFr Bld: 5.1 % (ref 4.8–5.6)

## 2024-05-09 LAB — HCV INTERPRETATION

## 2024-05-10 LAB — CULTURE, OB URINE

## 2024-05-10 LAB — URINE CULTURE, OB REFLEX

## 2024-05-15 LAB — HORIZON CUSTOM

## 2024-05-17 LAB — PANORAMA PRENATAL TEST FULL PANEL:PANORAMA TEST PLUS 5 ADDITIONAL MICRODELETIONS: FETAL FRACTION: 4.3

## 2024-05-19 ENCOUNTER — Ambulatory Visit: Payer: Self-pay | Admitting: Obstetrics & Gynecology

## 2024-05-19 DIAGNOSIS — Z3480 Encounter for supervision of other normal pregnancy, unspecified trimester: Secondary | ICD-10-CM

## 2024-05-22 ENCOUNTER — Other Ambulatory Visit (HOSPITAL_COMMUNITY)
Admission: RE | Admit: 2024-05-22 | Discharge: 2024-05-22 | Disposition: A | Discharge status: Home / Self Care | Source: Ambulatory Visit | Attending: Obstetrics and Gynecology | Admitting: Obstetrics and Gynecology

## 2024-05-22 ENCOUNTER — Ambulatory Visit: Admitting: Obstetrics and Gynecology

## 2024-05-22 VITALS — BP 113/75 | HR 99 | Wt 182.0 lb

## 2024-05-22 DIAGNOSIS — Z3A14 14 weeks gestation of pregnancy: Secondary | ICD-10-CM | POA: Diagnosis not present

## 2024-05-22 DIAGNOSIS — Z3480 Encounter for supervision of other normal pregnancy, unspecified trimester: Secondary | ICD-10-CM | POA: Diagnosis present

## 2024-05-22 DIAGNOSIS — Z3482 Encounter for supervision of other normal pregnancy, second trimester: Secondary | ICD-10-CM

## 2024-05-22 NOTE — Progress Notes (Signed)
 Chief Complaint  Patient presents with   Initial Prenatal Visit    Subjective:   Kim Powell is a 33 y.o. G3P2002 at [redacted]w[redacted]d by early ultrasound being seen today for her first obstetrical visit.  Her obstetrical history is significant for none. Patient does intend to breast feed. Pregnancy history fully reviewed.  Patient reports no complaints.  HISTORY: OB History  Gravida Para Term Preterm AB Living  3 2 2  0 0 2  SAB IAB Ectopic Multiple Live Births  0 0 0 0 2    # Outcome Date GA Lbr Len/2nd Weight Sex Type Anes PTL Lv  3 Current           2 Term 01/30/21 [redacted]w[redacted]d 30:32 / 00:22 7 lb 11 oz (3.487 kg) M Vag-Spont EPI  LIV     Name: Sliker,BOY Jenai     Apgar1: 8  Apgar5: 9  1 Term 11/09/16 [redacted]w[redacted]d 34:00 / 01:27 7 lb 14.7 oz (3.592 kg) M Vag-Spont EPI  LIV     Name: Kirst,BOY Sruti     Apgar1: 9  Apgar5: 9     Last pap smear: Lab Results  Component Value Date   DIAGPAP  07/14/2020    - Negative for intraepithelial lesion or malignancy (NILM)     Past Medical History:  Diagnosis Date   Asthma    Past Surgical History:  Procedure Laterality Date   NO PAST SURGERIES     Family History  Problem Relation Age of Onset   Healthy Mother    Hypertension Father    Heart attack Father    Cancer Maternal Grandmother    Social History[1] Allergies[2] Medications Ordered Prior to Encounter[3]   Exam   Vitals:   05/22/24 1507  BP: 113/75  Pulse: 99  Weight: 182 lb (82.6 kg)   Fetal Heart Rate (bpm): 158  General:  Alert, oriented and cooperative. Patient is in no acute distress.  Breast: deferred  Cardiovascular: Normal heart rate noted  Respiratory: Normal respiratory effort, no problems with respiration noted  Abdomen: Soft, gravid, appropriate for gestational age.  Pain/Pressure: Absent     Pelvic: Cervical exam deferred      EGBUS within normal limits, cervix normal, pap collected, nurse chaperone present  Extremities: Normal range of motion.  Edema: None   Mental Status: Normal mood and affect. Normal behavior. Normal judgment and thought content.    Assessment:   Pregnancy: [id number] Patient Active Problem List   Diagnosis Date Noted   Supervision of other normal pregnancy, antepartum 05/08/2024     Plan:  1. Supervision of other normal pregnancy, antepartum (Primary) Anticipatory guidance - Cervicovaginal ancillary only( [place]) - Cytology - PAP( [place])  2. [redacted] weeks gestation of pregnancy - Cervicovaginal ancillary only( [place]) - Cytology - PAP( [place])    Initial labs drawn. Continue prenatal vitamins. Genetic Screening discussed: NIPS, carrier screening and AFP, results reviewed. Ultrasound discussed; fetal anatomic survey: requested. Problem list reviewed and updated. The nature of  - [facility] with multiple MDs and other Advanced Practice Providers was explained to patient; also emphasized that residents, students are part of our team. Routine obstetric precautions reviewed. Return in about 4 weeks (around 06/19/2024).  Rollo ONEIDA Bring, MD, FACOG Obstetrician & Gynecologist, [facility] for [facility], [facility]    [1]  Social History Tobacco Use   Smoking status: Never   Smokeless tobacco: Never  Vaping Use  Vaping status: Never Used  Substance Use Topics   Alcohol use: Not Currently    Comment: Last drink in November   Drug use: No  [2]  Allergies Allergen Reactions   Cat Dander   [3]  Current Outpatient Medications on File Prior to Visit  Medication Sig Dispense Refill   Blood Pressure Monitoring (BLOOD PRESSURE KIT) DEVI 1 Device by Does not apply route once a week. 1 each 0   Prenatal Vit-Fe Fumarate-FA (PRENATAL VITAMIN PO) Take 1 tablet by mouth daily.     No current facility-administered medications on file prior to visit.

## 2024-05-23 DIAGNOSIS — B3731 Acute candidiasis of vulva and vagina: Secondary | ICD-10-CM

## 2024-05-23 DIAGNOSIS — Z3480 Encounter for supervision of other normal pregnancy, unspecified trimester: Secondary | ICD-10-CM

## 2024-05-23 LAB — CERVICOVAGINAL ANCILLARY ONLY
Chlamydia: NEGATIVE
Comment: NEGATIVE
Comment: NEGATIVE
Comment: NORMAL
Neisseria Gonorrhea: NEGATIVE
Trichomonas: NEGATIVE

## 2024-05-24 LAB — CYTOLOGY - PAP
Comment: NEGATIVE
Diagnosis: NEGATIVE
High risk HPV: NEGATIVE

## 2024-05-25 MED ORDER — MICONAZOLE NITRATE 2 % VA CREA: 1.0000 | TOPICAL_CREAM | Freq: Every day | VAGINAL | 0 refills | Status: AC

## 2024-06-05 ENCOUNTER — Other Ambulatory Visit: Payer: Self-pay | Admitting: *Deleted

## 2024-06-05 DIAGNOSIS — Z3480 Encounter for supervision of other normal pregnancy, unspecified trimester: Secondary | ICD-10-CM

## 2024-06-14 DIAGNOSIS — O99210 Obesity complicating pregnancy, unspecified trimester: Secondary | ICD-10-CM | POA: Insufficient documentation

## 2024-06-22 ENCOUNTER — Encounter: Payer: Self-pay | Admitting: Obstetrics and Gynecology

## 2024-06-22 ENCOUNTER — Ambulatory Visit: Payer: Self-pay | Admitting: Obstetrics and Gynecology

## 2024-06-22 VITALS — BP 128/73 | HR 84 | Wt 190.0 lb

## 2024-06-22 DIAGNOSIS — Z3482 Encounter for supervision of other normal pregnancy, second trimester: Secondary | ICD-10-CM

## 2024-06-22 DIAGNOSIS — Z3480 Encounter for supervision of other normal pregnancy, unspecified trimester: Secondary | ICD-10-CM

## 2024-06-22 DIAGNOSIS — Z3A18 18 weeks gestation of pregnancy: Secondary | ICD-10-CM | POA: Diagnosis not present

## 2024-06-22 NOTE — Patient Instructions (Signed)
   Considering Waterbirth? Guide for patients at [facility]) Why consider waterbirth? Gentle birth for babies  Less pain medicine used in labor  May allow for passive descent/less pushing  May reduce perineal tears  More mobility and instinctive maternal position changes  Increased maternal relaxation   Is waterbirth safe? What are the risks of infection, drowning or other complications? Infection:  Very low risk (3.7 % for tub vs 4.8% for bed)  7 in 8000 waterbirths with documented infection  Poorly cleaned equipment most common cause  Slightly lower group B strep transmission rate  Drowning  Maternal:  Very low risk  Related to seizures or fainting  Newborn:  Very low risk. No evidence of increased risk of respiratory problems in multiple large studies  Physiological protection from breathing under water  Avoid underwater birth if there are any fetal complications  Once baby's head is out of the water, keep it out.  Birth complication  Some reports of cord trauma, but risk decreased by bringing baby to surface gradually  No evidence of increased risk of shoulder dystocia. Mothers can usually change positions faster in water than in a bed, possibly aiding the maneuvers to free the shoulder.   There are 2 things you MUST do to have a waterbirth with [facility]: Attend a waterbirth class at [facility] [employer] [facility] at [facility]   3rd Wednesday of every month from 7-9 pm (virtual during COVID) [employer] at [email or web address] or [email or web address] or by calling [phone] Bring us  the certificate from the class to your prenatal appointment or send via MyChart Meet with a midwife at 36 weeks* to see if you can still plan a waterbirth and to sign the consent.   *We also recommend that you schedule as many of your prenatal visits with a midwife as possible.    Helpful information: You may want to bring a bathing suit top to the hospital  to wear during labor but this is optional.  All other supplies are provided by the hospital. Please arrive at the hospital with signs of active labor, and do not wait at home until late in labor. It takes 45 min- 1 hour for fetal monitoring, and check in to your room to take place, plus transport and filling of the waterbirth tub.    Things that would prevent you from having a waterbirth: Premature, <37wks  Previous cesarean birth  Presence of thick meconium-stained fluid  Multiple gestation (Twins, triplets, etc.)  Uncontrolled diabetes or gestational diabetes requiring medication  Hypertension diagnosed in pregnancy or preexisting hypertension (gestational hypertension, preeclampsia, or chronic hypertension) Fetal growth restriction (your baby measures less than 10th percentile on ultrasound) Heavy vaginal bleeding  Non-reassuring fetal heart rate  Active infection (MRSA, etc.). Group B Strep is NOT a contraindication for waterbirth.  If your labor has to be induced and induction method requires continuous monitoring of the baby's heart rate  Other risks/issues identified by your obstetrical provider   Please remember that birth is unpredictable. Under certain unforeseeable circumstances your provider may advise against giving birth in the tub. These decisions will be made on a case-by-case basis and with the safety of you and your baby as our highest priority.    Updated 09/09/21

## 2024-06-22 NOTE — Progress Notes (Signed)
" ° °  PRENATAL VISIT NOTE  Subjective:  Kim Powell is a 34 y.o. G3P2002 at [redacted]w[redacted]d being seen today for ongoing prenatal care.  She is currently monitored for the following issues for this low-risk pregnancy and has Supervision of other normal pregnancy, antepartum and Obesity affecting pregnancy, antepartum on their problem list.  Patient reports no complaints.  Contractions: Not present. Vag. Bleeding: None.  Movement: Present. Denies leaking of fluid.   The following portions of the patient's history were reviewed and updated as appropriate: allergies, current medications, past family history, past medical history, past social history, past surgical history and problem list.   Objective:   Vitals:   06/22/24 1128  BP: 128/73  Pulse: 84  Weight: 190 lb (86.2 kg)    Fetal Status:  Fetal Heart Rate (bpm): 146   Movement: Present    General: Alert, oriented and cooperative. Patient is in no acute distress.  Skin: Skin is warm and dry. No rash noted.   Cardiovascular: Normal heart rate noted  Respiratory: Normal respiratory effort, no problems with respiration noted  Abdomen: Soft, gravid, appropriate for gestational age.  Pain/Pressure: Absent     Pelvic: Cervical exam deferred        Extremities: Normal range of motion.  Edema: None  Mental Status: Normal mood and affect. Normal behavior. Normal judgment and thought content.      Assessment and Plan:  Pregnancy: G3P2002 at [redacted]w[redacted]d 1. Supervision of other normal pregnancy, antepartum (Primary) BP and FHR normal Doing well, feeling regular movement    2. [redacted] weeks gestation of pregnancy Anatomy scan  1/22 Interested potentially in waterbirth, class info given   Preterm labor symptoms and general obstetric precautions including but not limited to vaginal bleeding, contractions, leaking of fluid and fetal movement were reviewed in detail with the patient. Please refer to After Visit Summary for other counseling recommendations.     Future Appointments  Date Time Provider Department Center  06/28/2024  2:00 PM [facility] PROVIDER 1 [facility]  06/28/2024  2:30 PM [facility] US2 [facility]  07/19/2024  4:10 PM Davis, Devon E, PA-C [facility] None    Nidia Daring, [place]  "

## 2024-06-22 NOTE — Progress Notes (Signed)
 Pt presents for ROB visit. Declined AFP. No concerns

## 2024-06-28 ENCOUNTER — Other Ambulatory Visit

## 2024-06-28 ENCOUNTER — Ambulatory Visit: Attending: Obstetrics and Gynecology | Admitting: Maternal & Fetal Medicine

## 2024-06-28 VITALS — BP 120/71 | HR 90

## 2024-06-28 DIAGNOSIS — Z3689 Encounter for other specified antenatal screening: Secondary | ICD-10-CM | POA: Diagnosis not present

## 2024-06-28 DIAGNOSIS — O99212 Obesity complicating pregnancy, second trimester: Secondary | ICD-10-CM | POA: Insufficient documentation

## 2024-06-28 DIAGNOSIS — Z3A19 19 weeks gestation of pregnancy: Secondary | ICD-10-CM | POA: Insufficient documentation

## 2024-06-28 DIAGNOSIS — O321XX0 Maternal care for breech presentation, not applicable or unspecified: Secondary | ICD-10-CM | POA: Insufficient documentation

## 2024-06-28 DIAGNOSIS — E669 Obesity, unspecified: Secondary | ICD-10-CM

## 2024-06-28 DIAGNOSIS — Z3480 Encounter for supervision of other normal pregnancy, unspecified trimester: Secondary | ICD-10-CM | POA: Diagnosis not present

## 2024-06-28 DIAGNOSIS — O99210 Obesity complicating pregnancy, unspecified trimester: Secondary | ICD-10-CM

## 2024-06-28 NOTE — Progress Notes (Signed)
 "  Patient information  Patient Name: Kim Powell  Patient MRN:   [id number]  Referring practice: MFM Referring Provider: [facility]  Problem List   Patient Active Problem List   Diagnosis Date Noted   Obesity affecting pregnancy, antepartum 06/14/2024   Supervision of other normal pregnancy, antepartum 05/08/2024    Maternal Fetal medicine Consult  Kim Powell is a 34 y.o. G3P2002 at [redacted]w[redacted]d here for ultrasound and consultation. Kim Powell is doing well today with no acute concerns. Today we focused on the following:   The patient is here today for ultrasound evaluation. Pregnancy is complicated by maternal obesity, with a pre-pregnancy BMI of 32. She has a history of two prior spontaneous vaginal deliveries without complications.  Prenatal genetic screening with NIPT is low risk. The patient declines disclosure of fetal sex, and this preference was respected during the ultrasound review.  Ultrasound today demonstrates normal fetal anatomy and normal fetal growth. No structural abnormalities are identified.  We discussed the importance of healthy nutrition and regular physical activity during pregnancy, including focusing on a balanced diet, appropriate gestational weight gain, and moderate-intensity exercise as tolerated, such as walking or prenatal-strength activities, in the absence of obstetric contraindications.  There are no additional indications for MFM follow-up at this time. Standard obstetric precautions were reviewed.  Sonographic findings Single intrauterine pregnancy at 19w 4d. Fetal cardiac activity:  Observed and appears normal. Presentation: Breech. The anatomic structures that were well seen appear normal. Due to poor acoustic windows some structures remain suboptimally visualized. Fetal biometry shows the estimated fetal weight of 0 lb 12 oz, 335 grams (78%). Amniotic fluid: Within normal limits.  MVP: 6.07 cm. Placenta: Posterior. Adnexa: No  abnormality visualized. Cervical length: 3.7 cm.  Recommendations -F/u growth and completion of anatoy in 5 weeks -Continue routine prenatal care with primary OB provider. -Maintain a balanced diet with attention to appropriate gestational weight gain. -Engage in regular, moderate-intensity exercise as tolerated unless otherwise contraindicated. -Respect patient preference not to disclose fetal sex. -Standard obstetric precautions reviewed.  The patient had time to ask questions that were answered to her satisfaction.  She verbalized understanding and agrees to proceed with the plan above.   I spent 45 minutes reviewing the patients chart, including labs and images as well as counseling the patient about her medical conditions. Greater than 50% of the time was spent in direct face-to-face patient counseling.  Delora Smaller  MFM, [place]   06/28/2024  3:24 PM   Review of Systems: A review of systems was performed and was negative except per HPI   Vitals and Physical Exam    06/28/2024    2:10 PM 06/22/2024   11:28 AM 05/22/2024    3:07 PM  Vitals with BMI  Weight  190 lbs 182 lbs  Systolic 120 128 886  Diastolic 71 73 75  Pulse 90 84 99    Sitting comfortably on the sonogram table Nonlabored breathing Normal rate and rhythm Abdomen is nontender  Past pregnancies OB History  Gravida Para Term Preterm AB Living  3 2 2  0 0 2  SAB IAB Ectopic Multiple Live Births  0 0 0 0 2    # Outcome Date GA Lbr Len/2nd Weight Sex Type Anes PTL Lv  3 Current           2 Term 01/30/21 [redacted]w[redacted]d 30:32 / 00:22 7 lb 11 oz (3.487 kg) M Vag-Spont EPI  LIV  1 Term 11/09/16 [redacted]w[redacted]d 34:00 / 01:27  7 lb 14.7 oz (3.592 kg) M Vag-Spont EPI  LIV     Future Appointments  Date Time Provider Department Center  07/19/2024  4:10 PM Davis, Devon E, PA-C [facility] None      "

## 2024-07-19 ENCOUNTER — Encounter: Payer: Self-pay | Admitting: Physician Assistant

## 2024-07-19 ENCOUNTER — Encounter: Admitting: Physician Assistant

## 2024-08-03 ENCOUNTER — Ambulatory Visit
